# Patient Record
Sex: Female | Born: 1990 | Race: White | Hispanic: No | Marital: Single | Smoking: Never smoker
Health system: Southern US, Community
[De-identification: ages and names within clinical notes are randomized; demographics above are authoritative.]

## PROBLEM LIST (undated history)

## (undated) DIAGNOSIS — E785 Hyperlipidemia, unspecified: Secondary | ICD-10-CM

## (undated) DIAGNOSIS — Z0000 Encounter for general adult medical examination without abnormal findings: Secondary | ICD-10-CM

## (undated) DIAGNOSIS — H6120 Impacted cerumen, unspecified ear: Secondary | ICD-10-CM

## (undated) DIAGNOSIS — Z124 Encounter for screening for malignant neoplasm of cervix: Secondary | ICD-10-CM

## (undated) DIAGNOSIS — B019 Varicella without complication: Secondary | ICD-10-CM

## (undated) DIAGNOSIS — L309 Dermatitis, unspecified: Secondary | ICD-10-CM

## (undated) DIAGNOSIS — F988 Other specified behavioral and emotional disorders with onset usually occurring in childhood and adolescence: Secondary | ICD-10-CM

## (undated) HISTORY — DX: Varicella without complication: B01.9

## (undated) HISTORY — DX: Encounter for general adult medical examination without abnormal findings: Z00.00

## (undated) HISTORY — DX: Hyperlipidemia, unspecified: E78.5

## (undated) HISTORY — PX: TYMPANOSTOMY TUBE PLACEMENT: SHX32

## (undated) HISTORY — DX: Impacted cerumen, unspecified ear: H61.20

## (undated) HISTORY — DX: Encounter for screening for malignant neoplasm of cervix: Z12.4

## (undated) HISTORY — DX: Dermatitis, unspecified: L30.9

## (undated) HISTORY — DX: Other specified behavioral and emotional disorders with onset usually occurring in childhood and adolescence: F98.8

---
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
## 2012-01-15 ENCOUNTER — Ambulatory Visit (INDEPENDENT_AMBULATORY_CARE_PROVIDER_SITE_OTHER): Payer: BC Managed Care – PPO | Admitting: Family Medicine

## 2012-01-15 ENCOUNTER — Encounter: Payer: Self-pay | Admitting: Family Medicine

## 2012-01-15 VITALS — BP 110/74 | HR 76 | Temp 98.0°F | Ht 63.0 in | Wt 148.1 lb

## 2012-01-15 DIAGNOSIS — F988 Other specified behavioral and emotional disorders with onset usually occurring in childhood and adolescence: Secondary | ICD-10-CM

## 2012-01-15 MED ORDER — METHYLPHENIDATE HCL 10 MG PO TABS: ORAL_TABLET | ORAL | Status: DC

## 2012-01-15 MED ORDER — METHYLPHENIDATE HCL ER (LA) 40 MG PO CP24: 40.0000 mg | ORAL_CAPSULE | ORAL | Status: DC

## 2012-01-15 NOTE — Assessment & Plan Note (Signed)
Has titrated up the ritalin short acting to 300 mg in am and is finding it helpful without any side effects. Is now in college and finds it wears off after a couple of hours. Will switch to long acting Ritalin LA 40 mg in am and may use Ritalin short acting 10 mg tabs 1-2 tabs po qpm as needed for long study days, recheck vitals and assess dosing in 1 month or as needed.

## 2012-01-15 NOTE — Progress Notes (Signed)
[name], female   DOB: [birth year], 21 y.o.   MRN: [id number] Katie Tucker [id number] Jan 01, 1991 01/15/2012      Progress Note-Follow Up  Subjective  Chief Complaint  Chief Complaint  Patient presents with  . Follow-up    3 week on ADD meds    HPI  Patient is an 21 year old Caucasian female who is in today for reevaluation of her ADD meds. She is accompanied by her father. She's just started college and they do agree to the Ritalin is helping some. She has titrated her Ritalin short acting tablets to 30 mg in the a.m. and is not taking anything in the p.m. She notes if she takes it around 6 AM he tends to wear off by noon. She feels she needs a little more coverage during the day. She denies any side effects or concerns. She denies headaches, insomnia, chest pain complications, GI or GU concerns at this time  Past Medical History  Diagnosis Date  . Chicken pox as a child  . ADD (attention deficit disorder) 4 th grade  . Preventative health care 12/25/2011    Past Surgical History  Procedure Date  . Tympanostomy tube placement     Family History  Problem Relation Age of Onset  . Hyperlipidemia Mother   . Hypertension Father   . Cancer Father 20    testicular  . Cancer Maternal Grandmother     breast- remission  . Hypertension Paternal Grandfather     History   Social History  . Marital Status: Single    Spouse Name: N/A    Number of Children: N/A  . Years of Education: N/A   Occupational History  . Not on file.   Social History Main Topics  . Smoking status: Never Smoker   . Smokeless tobacco: Never Used  . Alcohol Use: No  . Drug Use: No  . Sexually Active: No   Other Topics Concern  . Not on file   Social History Narrative  . No narrative on file    Current Outpatient Prescriptions on File Prior to Visit  Medication Sig Dispense Refill  . DISCONTD: methylphenidate (RITALIN) 10 MG tablet Take 1 tablet (10 mg total) by mouth 2 (two)  times daily.  60 tablet  0    No Known Allergies  Review of Systems  Review of Systems  Constitutional: Negative for fever and malaise/fatigue.  HENT: Negative for congestion.   Eyes: Negative for discharge.  Respiratory: Negative for shortness of breath.   Cardiovascular: Negative for chest pain, palpitations and leg swelling.  Gastrointestinal: Negative for nausea, abdominal pain and diarrhea.  Genitourinary: Negative for dysuria.  Musculoskeletal: Negative for falls.  Skin: Negative for rash.  Neurological: Negative for loss of consciousness and headaches.  Endo/Heme/Allergies: Negative for polydipsia.  Psychiatric/Behavioral: Negative for depression and suicidal ideas. The patient is not nervous/anxious and does not have insomnia.     Objective  BP 110/74  Pulse 76  Temp 98 F (36.7 C) (Temporal)  Ht 5\' 3"  (1.6 m)  Wt 148 lb 1.9 oz (67.187 kg)  BMI 26.24 kg/m2  SpO2 100%  LMP 12/10/2011  Physical Exam  Physical Exam  Constitutional: She is oriented to person, place, and time and well-developed, well-nourished, and in no distress. No distress.  HENT:  Head: Normocephalic and atraumatic.  Eyes: Conjunctivae normal are normal. Right eye exhibits no discharge. Left eye exhibits no discharge.  Neck: Neck supple. No thyromegaly present.  Cardiovascular: Normal rate, regular rhythm  and normal heart sounds.   No murmur heard. Pulmonary/Chest: Effort normal and breath sounds normal. She has no wheezes.  Abdominal: Soft. Bowel sounds are normal.  Musculoskeletal: She exhibits no edema.  Lymphadenopathy:    She has no cervical adenopathy.  Neurological: She is alert and oriented to person, place, and time.  Skin: Skin is warm and dry. No rash noted. She is not diaphoretic.  Psychiatric: Memory, affect and judgment normal.      Assessment & Plan  ADD (attention deficit disorder) Has titrated up the ritalin short acting to 300 mg in am and is finding it helpful  without any side effects. Is now in college and finds it wears off after a couple of hours. Will switch to long acting Ritalin LA 40 mg in am and may use Ritalin short acting 10 mg tabs 1-2 tabs po qpm as needed for long study days, recheck vitals and assess dosing in 1 month or as needed.

## 2012-01-15 NOTE — Patient Instructions (Addendum)
Attention Deficit Hyperactivity Disorder Attention deficit hyperactivity disorder (ADHD) is a problem with behavior issues based on the way the brain functions (neurobehavioral disorder). It is a common reason for behavior and academic problems in school. CAUSES  The cause of ADHD is unknown in most cases. It may run in families. It sometimes can be associated with learning disabilities and other behavioral problems. SYMPTOMS  There are 3 types of ADHD. The 3 types and some of the symptoms include:  Inattentive   Gets bored or distracted easily.   Loses or forgets things. Forgets to hand in homework.   Has trouble organizing or completing tasks.   Difficulty staying on task.   An inability to organize daily tasks and school work.   Leaving projects, chores, or homework unfinished.   Trouble paying attention or responding to details. Careless mistakes.   Difficulty following directions. Often seems like is not listening.   Dislikes activities that require sustained attention (like chores or homework).   Hyperactive-impulsive   Feels like it is impossible to sit still or stay in a seat. Fidgeting with hands and feet.   Trouble waiting turn.   Talking too much or out of turn. Interruptive.   Speaks or acts impulsively.   Aggressive, disruptive behavior.   Constantly busy or on the go, noisy.   Combined   Has symptoms of both of the above.  Often children with ADHD feel discouraged about themselves and with school. They often perform well below their abilities in school. These symptoms can cause problems in home, school, and in relationships with peers. As children get older, the excess motor activities can calm down, but the problems with paying attention and staying organized persist. Most children do not outgrow ADHD but with good treatment can learn to cope with the symptoms. DIAGNOSIS  When ADHD is suspected, the diagnosis should be made by professionals trained in  ADHD.  Diagnosis will include:  Ruling out other reasons for the child's behavior.   The caregivers will check with the child's school and check their medical records.   They will talk to teachers and parents.   Behavior rating scales for the child will be filled out by those dealing with the child on a daily basis.  A diagnosis is made only after all information has been considered. TREATMENT  Treatment usually includes behavioral treatment often along with medicines. It may include stimulant medicines. The stimulant medicines decrease impulsivity and hyperactivity and increase attention. Other medicines used include antidepressants and certain blood pressure medicines. Most experts agree that treatment for ADHD should address all aspects of the child's functioning. Treatment should not be limited to the use of medicines alone. Treatment should include structured classroom management. The parents must receive education to address rewarding good behavior, discipline, and limit-setting. Tutoring or behavioral therapy or both should be available for the child. If untreated, the disorder can have long-term serious effects into adolescence and adulthood. HOME CARE INSTRUCTIONS   Often with ADHD there is a lot of frustration among the family in dealing with the illness. There is often blame and anger that is not warranted. This is a life long illness. There is no way to prevent ADHD. In many cases, because the problem affects the family as a whole, the entire family may need help. A therapist can help the family find better ways to handle the disruptive behaviors and promote change. If the child is young, most of the therapist's work is with the parents. Parents will   learn techniques for coping with and improving their child's behavior. Sometimes only the child with the ADHD needs counseling. Your caregivers can help you make these decisions.   Children with ADHD may need help in organizing. Some  helpful tips include:   Keep routines the same every day from wake-up time to bedtime. Schedule everything. This includes homework and playtime. This should include outdoor and indoor recreation. Keep the schedule on the refrigerator or a bulletin board where it is frequently seen. Mark schedule changes as far in advance as possible.   Have a place for everything and keep everything in its place. This includes clothing, backpacks, and school supplies.   Encourage writing down assignments and bringing home needed books.   Offer your child a well-balanced diet. Breakfast is especially important for school performance. Children should avoid drinks with caffeine including:   Soft drinks.   Coffee.   Tea.   However, some older children (adolescents) may find these drinks helpful in improving their attention.   Children with ADHD need consistent rules that they can understand and follow. If rules are followed, give small rewards. Children with ADHD often receive, and expect, criticism. Look for good behavior and praise it. Set realistic goals. Give clear instructions. Look for activities that can foster success and self-esteem. Make time for pleasant activities with your child. Give lots of affection.   Parents are their children's greatest advocates. Learn as much as possible about ADHD. This helps you become a stronger and better advocate for your child. It also helps you educate your child's teachers and instructors if they feel inadequate in these areas. Parent support groups are often helpful. A national group with local chapters is called CHADD (Children and Adults with Attention Deficit Hyperactivity Disorder).  PROGNOSIS  There is no cure for ADHD. Children with the disorder seldom outgrow it. Many find adaptive ways to accommodate the ADHD as they mature. SEEK MEDICAL CARE IF:  Your child has repeated muscle twitches, cough or speech outbursts.   Your child has sleep problems.   Your  child has a marked loss of appetite.   Your child develops depression.   Your child has new or worsening behavioral problems.   Your child develops dizziness.   Your child has a racing heart.   Your child has stomach pains.   Your child develops headaches.  Document Released: 03/29/2002 Document Revised: 03/28/2011 Document Reviewed: 11/09/2007 [facility] Patient Information 2012 [place], [place].

## 2012-02-12 ENCOUNTER — Encounter: Payer: Self-pay | Admitting: Family Medicine

## 2012-02-12 ENCOUNTER — Ambulatory Visit (INDEPENDENT_AMBULATORY_CARE_PROVIDER_SITE_OTHER): Payer: BC Managed Care – PPO | Admitting: Family Medicine

## 2012-02-12 VITALS — BP 127/83 | HR 99 | Temp 98.8°F | Ht 63.0 in | Wt 149.1 lb

## 2012-02-12 DIAGNOSIS — F988 Other specified behavioral and emotional disorders with onset usually occurring in childhood and adolescence: Secondary | ICD-10-CM

## 2012-02-12 MED ORDER — METHYLPHENIDATE HCL ER (LA) 40 MG PO CP24: 40.0000 mg | ORAL_CAPSULE | ORAL | Status: DC

## 2012-02-12 NOTE — Patient Instructions (Addendum)
Attention Deficit Hyperactivity Disorder Attention deficit hyperactivity disorder (ADHD) is a problem with behavior issues based on the way the brain functions (neurobehavioral disorder). It is a common reason for behavior and academic problems in school. CAUSES  The cause of ADHD is unknown in most cases. It may run in families. It sometimes can be associated with learning disabilities and other behavioral problems. SYMPTOMS  There are 3 types of ADHD. The 3 types and some of the symptoms include:  Inattentive  Gets bored or distracted easily.  Loses or forgets things. Forgets to hand in homework.  Has trouble organizing or completing tasks.  Difficulty staying on task.  An inability to organize daily tasks and school work.  Leaving projects, chores, or homework unfinished.  Trouble paying attention or responding to details. Careless mistakes.  Difficulty following directions. Often seems like is not listening.  Dislikes activities that require sustained attention (like chores or homework).  Hyperactive-impulsive  Feels like it is impossible to sit still or stay in a seat. Fidgeting with hands and feet.  Trouble waiting turn.  Talking too much or out of turn. Interruptive.  Speaks or acts impulsively.  Aggressive, disruptive behavior.  Constantly busy or on the go, noisy.  Combined  Has symptoms of both of the above. Often children with ADHD feel discouraged about themselves and with school. They often perform well below their abilities in school. These symptoms can cause problems in home, school, and in relationships with peers. As children get older, the excess motor activities can calm down, but the problems with paying attention and staying organized persist. Most children do not outgrow ADHD but with good treatment can learn to cope with the symptoms. DIAGNOSIS  When ADHD is suspected, the diagnosis should be made by professionals trained in ADHD.  Diagnosis will  include:  Ruling out other reasons for the child's behavior.  The caregivers will check with the child's school and check their medical records.  They will talk to teachers and parents.  Behavior rating scales for the child will be filled out by those dealing with the child on a daily basis. A diagnosis is made only after all information has been considered. TREATMENT  Treatment usually includes behavioral treatment often along with medicines. It may include stimulant medicines. The stimulant medicines decrease impulsivity and hyperactivity and increase attention. Other medicines used include antidepressants and certain blood pressure medicines. Most experts agree that treatment for ADHD should address all aspects of the child's functioning. Treatment should not be limited to the use of medicines alone. Treatment should include structured classroom management. The parents must receive education to address rewarding good behavior, discipline, and limit-setting. Tutoring or behavioral therapy or both should be available for the child. If untreated, the disorder can have long-term serious effects into adolescence and adulthood. HOME CARE INSTRUCTIONS   Often with ADHD there is a lot of frustration among the family in dealing with the illness. There is often blame and anger that is not warranted. This is a life long illness. There is no way to prevent ADHD. In many cases, because the problem affects the family as a whole, the entire family may need help. A therapist can help the family find better ways to handle the disruptive behaviors and promote change. If the child is young, most of the therapist's work is with the parents. Parents will learn techniques for coping with and improving their child's behavior. Sometimes only the child with the ADHD needs counseling. Your caregivers can help   you make these decisions.  Children with ADHD may need help in organizing. Some helpful tips include:  Keep  routines the same every day from wake-up time to bedtime. Schedule everything. This includes homework and playtime. This should include outdoor and indoor recreation. Keep the schedule on the refrigerator or a bulletin board where it is frequently seen. Mark schedule changes as far in advance as possible.  Have a place for everything and keep everything in its place. This includes clothing, backpacks, and school supplies.  Encourage writing down assignments and bringing home needed books.  Offer your child a well-balanced diet. Breakfast is especially important for school performance. Children should avoid drinks with caffeine including:  Soft drinks.  Coffee.  Tea.  However, some older children (adolescents) may find these drinks helpful in improving their attention.  Children with ADHD need consistent rules that they can understand and follow. If rules are followed, give small rewards. Children with ADHD often receive, and expect, criticism. Look for good behavior and praise it. Set realistic goals. Give clear instructions. Look for activities that can foster success and self-esteem. Make time for pleasant activities with your child. Give lots of affection.  Parents are their children's greatest advocates. Learn as much as possible about ADHD. This helps you become a stronger and better advocate for your child. It also helps you educate your child's teachers and instructors if they feel inadequate in these areas. Parent support groups are often helpful. A national group with local chapters is called CHADD (Children and Adults with Attention Deficit Hyperactivity Disorder). PROGNOSIS  There is no cure for ADHD. Children with the disorder seldom outgrow it. Many find adaptive ways to accommodate the ADHD as they mature. SEEK MEDICAL CARE IF:  Your child has repeated muscle twitches, cough or speech outbursts.  Your child has sleep problems.  Your child has a marked loss of  appetite.  Your child develops depression.  Your child has new or worsening behavioral problems.  Your child develops dizziness.  Your child has a racing heart.  Your child has stomach pains.  Your child develops headaches. Document Released: 03/29/2002 Document Revised: 07/01/2011 Document Reviewed: 11/09/2007 [employer] Patient Information 2013 [employer].  

## 2012-02-16 NOTE — Progress Notes (Signed)
[name], female   DOB: 1990-08-29, 21 y.o.   MRN: [id number] Blayne Garlick [id number] 19-Mar-1991 02/16/2012      Progress Note-Follow Up  Subjective  Chief Complaint  Chief Complaint  Patient presents with  . Follow-up    4 week    HPI  Patient is a 21 year old Caucasian female who is in today for followup on her Ritalin. She is pleased with increased dose of extended release 40 mg. She is focusing better and doing better in school. She denies headaches, chest pain, palpitations, shortness of breath, insomnia, increased anxiety or other concerns with the medications.  Past Medical History  Diagnosis Date  . Chicken pox as a child  . ADD (attention deficit disorder) 4 th grade  . Preventative health care 12/25/2011    Past Surgical History  Procedure Date  . Tympanostomy tube placement     Family History  Problem Relation Age of Onset  . Hyperlipidemia Mother   . Hypertension Father   . Cancer Father 20    testicular  . Cancer Maternal Grandmother     breast- remission  . Hypertension Paternal Grandfather     History   Social History  . Marital Status: Single    Spouse Name: N/A    Number of Children: N/A  . Years of Education: N/A   Occupational History  . Not on file.   Social History Main Topics  . Smoking status: Never Smoker   . Smokeless tobacco: Never Used  . Alcohol Use: No  . Drug Use: No  . Sexually Active: No   Other Topics Concern  . Not on file   Social History Narrative  . No narrative on file    Current Outpatient Prescriptions on File Prior to Visit  Medication Sig Dispense Refill  . methylphenidate (RITALIN LA) 40 MG 24 hr capsule Take 1 capsule (40 mg total) by mouth every morning. January 2014 rx  30 capsule  0    No Known Allergies  Review of Systems  Review of Systems  Constitutional: Negative for fever and malaise/fatigue.  HENT: Negative for congestion.   Eyes: Negative for discharge.  Respiratory:  Negative for shortness of breath.   Cardiovascular: Negative for chest pain, palpitations and leg swelling.  Gastrointestinal: Negative for nausea, abdominal pain and diarrhea.  Genitourinary: Negative for dysuria.  Musculoskeletal: Negative for falls.  Skin: Negative for rash.  Neurological: Negative for loss of consciousness and headaches.  Endo/Heme/Allergies: Negative for polydipsia.  Psychiatric/Behavioral: Negative for depression and suicidal ideas. The patient is not nervous/anxious and does not have insomnia.     Objective  BP 127/83  Pulse 99  Temp 98.8 F (37.1 C) (Temporal)  Ht 5\' 3"  (1.6 m)  Wt 149 lb 1.9 oz (67.64 kg)  BMI 26.42 kg/m2  SpO2 99%  LMP 02/03/2012  Physical Exam  Physical Exam  Constitutional: She is oriented to person, place, and time and well-developed, well-nourished, and in no distress. No distress.  HENT:  Head: Normocephalic and atraumatic.  Eyes: Conjunctivae normal are normal.  Neck: Neck supple. No thyromegaly present.  Cardiovascular: Normal rate, regular rhythm and normal heart sounds.   No murmur heard. Pulmonary/Chest: Effort normal and breath sounds normal. She has no wheezes.  Abdominal: She exhibits no distension and no mass.  Musculoskeletal: She exhibits no edema.  Lymphadenopathy:    She has no cervical adenopathy.  Neurological: She is alert and oriented to person, place, and time.  Skin: Skin is warm and  dry. No rash noted. She is not diaphoretic.  Psychiatric: Memory, affect and judgment normal.      Assessment & Plan  ADD (attention deficit disorder) Good response to Ritalin LA 40 mg daily, only using the 10 mg in afternoon infrequently. Given refills today

## 2012-02-16 NOTE — Assessment & Plan Note (Addendum)
Good response to Ritalin LA 40 mg daily, only using the 10 mg in afternoon infrequently. Given refills today

## 2012-05-13 ENCOUNTER — Ambulatory Visit: Payer: BC Managed Care – PPO | Admitting: Family Medicine

## 2012-06-26 ENCOUNTER — Ambulatory Visit: Payer: BC Managed Care – PPO | Admitting: Family Medicine

## 2012-09-16 ENCOUNTER — Ambulatory Visit: Payer: BC Managed Care – PPO | Admitting: Family Medicine

## 2012-11-04 ENCOUNTER — Encounter: Payer: Self-pay | Admitting: Nurse Practitioner

## 2012-11-04 ENCOUNTER — Ambulatory Visit (INDEPENDENT_AMBULATORY_CARE_PROVIDER_SITE_OTHER): Payer: BC Managed Care – PPO | Admitting: Nurse Practitioner

## 2012-11-04 VITALS — BP 90/54 | HR 75 | Temp 98.3°F | Ht 63.0 in | Wt 149.0 lb

## 2012-11-04 DIAGNOSIS — H6091 Unspecified otitis externa, right ear: Secondary | ICD-10-CM

## 2012-11-04 DIAGNOSIS — H60399 Other infective otitis externa, unspecified ear: Secondary | ICD-10-CM

## 2012-11-04 MED ORDER — OFLOXACIN 0.3 % OT SOLN: 10.0000 [drp] | Freq: Every day | OTIC | Status: DC

## 2012-11-04 NOTE — Progress Notes (Signed)
  Subjective:    [name], female    DOB: July 19, 1990, 22 y.o.   MRN: [id number]  Otalgia  There is pain in the right ear. This is a new problem. The current episode started 1 to 4 weeks ago (12 days). The problem occurs constantly. The problem has been gradually worsening. There has been no fever. The pain is mild. Pertinent negatives include no abdominal pain, coughing, diarrhea, drainage, ear discharge, headaches, hearing loss, neck pain, rash or sore throat. Associated symptoms comments: [place] She has tried NSAIDs for the symptoms. The treatment provided significant relief.      Review of Systems  Constitutional: Negative for fever, chills, activity change, appetite change and fatigue.  HENT: Positive for ear pain. Negative for hearing loss, congestion, sore throat, neck pain, postnasal drip, sinus pressure, tinnitus and ear discharge.   Eyes: Negative for redness.  Respiratory: Negative for cough.   Gastrointestinal: Negative for abdominal pain and diarrhea.  Skin: Negative for rash.  Neurological: Negative for headaches.  Hematological: Negative for adenopathy.       Objective:   Physical Exam  Vitals reviewed. Constitutional: She is oriented to person, place, and time. She appears well-developed and well-nourished. No distress.  HENT:  Head: Normocephalic and atraumatic.  Right Ear: There is drainage, swelling and tenderness. No mastoid tenderness. Tympanic membrane is not injected.  Left Ear: External ear normal.  Ears:  Mouth/Throat: Oropharynx is clear and moist. No oropharyngeal exudate.  Eyes: Conjunctivae are normal. Right eye exhibits no discharge. Left eye exhibits no discharge.  Neck: No thyromegaly present.  Cardiovascular: Normal rate, regular rhythm and normal heart sounds.   No murmur heard. Pulmonary/Chest: Effort normal and breath sounds normal. She has no wheezes.  Musculoskeletal:  Normal gait  Lymphadenopathy:   She has no cervical adenopathy.  Neurological: She is oriented to person, place, and time.  Skin: Skin is warm and dry. No rash noted.  Psychiatric: She has a normal mood and affect. Her behavior is normal. Thought content normal.          Assessment & Plan:  1. Otitis externa of right ear See intrucstions. - ofloxacin (FLOXIN) 0.3 % otic solution; Place 10 drops into the right ear daily.  Dispense: 5 mL; Refill: 0

## 2012-11-04 NOTE — Patient Instructions (Addendum)
Keep ear dry. No swimming for 7-10 days, or use ear plugs. Use cotton ball when getting in & out of shower.   Otitis Externa Otitis externa is a bacterial or fungal infection of the outer ear canal. This is the area from the eardrum to the outside of the ear. Otitis externa is sometimes called "swimmer's ear." CAUSES  Possible causes of infection include:  Swimming in dirty water.  Moisture remaining in the ear after swimming or bathing.  Mild injury (trauma) to the ear.  Objects stuck in the ear (foreign body).  Cuts or scrapes (abrasions) on the outside of the ear. SYMPTOMS  The first symptom of infection is often itching in the ear canal. Later signs and symptoms may include swelling and redness of the ear canal, ear pain, and yellowish-white fluid (pus) coming from the ear. The ear pain may be worse when pulling on the earlobe. DIAGNOSIS  Your caregiver will perform a physical exam. A sample of fluid may be taken from the ear and examined for bacteria or fungi. TREATMENT  Antibiotic ear drops are often given for 10 to 14 days. Treatment may also include pain medicine or corticosteroids to reduce itching and swelling. PREVENTION   Keep your ear dry. Use the corner of a towel to absorb water out of the ear canal after swimming or bathing.  Avoid scratching or putting objects inside your ear. This can damage the ear canal or remove the protective wax that lines the canal. This makes it easier for bacteria and fungi to grow.  Avoid swimming in lakes, polluted water, or poorly chlorinated pools.  You may use ear drops made of rubbing alcohol and vinegar after swimming. Combine equal parts of white vinegar and alcohol in a bottle. Put 3 or 4 drops into each ear after swimming. HOME CARE INSTRUCTIONS   Apply antibiotic ear drops to the ear canal as prescribed by your caregiver.  Only take over-the-counter or prescription medicines for pain, discomfort, or fever as directed by your  caregiver.  If you have diabetes, follow any additional treatment instructions from your caregiver.  Keep all follow-up appointments as directed by your caregiver. SEEK MEDICAL CARE IF:   You have a fever.  Your ear is still red, swollen, painful, or draining pus after 3 days.  Your redness, swelling, or pain gets worse.  You have a severe headache.  You have redness, swelling, pain, or tenderness in the area behind your ear. MAKE SURE YOU:   Understand these instructions.  Will watch your condition.  Will get help right away if you are not doing well or get worse. Document Released: 04/08/2005 Document Revised: 07/01/2011 Document Reviewed: 04/25/2011 [facility] Patient Information 2014 [place], [place].

## 2012-12-11 ENCOUNTER — Emergency Department (HOSPITAL_BASED_OUTPATIENT_CLINIC_OR_DEPARTMENT_OTHER): Payer: BC Managed Care – PPO

## 2012-12-11 ENCOUNTER — Encounter (HOSPITAL_BASED_OUTPATIENT_CLINIC_OR_DEPARTMENT_OTHER): Payer: Self-pay | Admitting: *Deleted

## 2012-12-11 ENCOUNTER — Emergency Department (HOSPITAL_BASED_OUTPATIENT_CLINIC_OR_DEPARTMENT_OTHER)
Admission: EM | Admit: 2012-12-11 | Discharge: 2012-12-11 | Disposition: A | Discharge status: Home / Self Care | Payer: BC Managed Care – PPO | Attending: Emergency Medicine | Admitting: Emergency Medicine

## 2012-12-11 DIAGNOSIS — S46909A Unspecified injury of unspecified muscle, fascia and tendon at shoulder and upper arm level, unspecified arm, initial encounter: Secondary | ICD-10-CM | POA: Insufficient documentation

## 2012-12-11 DIAGNOSIS — F988 Other specified behavioral and emotional disorders with onset usually occurring in childhood and adolescence: Secondary | ICD-10-CM | POA: Insufficient documentation

## 2012-12-11 DIAGNOSIS — Y92410 Unspecified street and highway as the place of occurrence of the external cause: Secondary | ICD-10-CM | POA: Insufficient documentation

## 2012-12-11 DIAGNOSIS — Y9389 Activity, other specified: Secondary | ICD-10-CM | POA: Insufficient documentation

## 2012-12-11 DIAGNOSIS — Z8619 Personal history of other infectious and parasitic diseases: Secondary | ICD-10-CM | POA: Insufficient documentation

## 2012-12-11 DIAGNOSIS — S6990XA Unspecified injury of unspecified wrist, hand and finger(s), initial encounter: Secondary | ICD-10-CM | POA: Insufficient documentation

## 2012-12-11 DIAGNOSIS — S4992XA Unspecified injury of left shoulder and upper arm, initial encounter: Secondary | ICD-10-CM

## 2012-12-11 DIAGNOSIS — S59909A Unspecified injury of unspecified elbow, initial encounter: Secondary | ICD-10-CM | POA: Insufficient documentation

## 2012-12-11 DIAGNOSIS — S4980XA Other specified injuries of shoulder and upper arm, unspecified arm, initial encounter: Secondary | ICD-10-CM | POA: Insufficient documentation

## 2012-12-11 MED ORDER — ACETAMINOPHEN 325 MG PO TABS
650.0000 mg | ORAL_TABLET | Freq: Once | ORAL | Status: AC
  Administered 2012-12-11: 650 mg via ORAL
  Filled 2012-12-11: qty 2

## 2012-12-11 MED ORDER — CYCLOBENZAPRINE HCL 10 MG PO TABS: 10.0000 mg | ORAL_TABLET | Freq: Two times a day (BID) | ORAL | Status: DC | PRN
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
## 2012-12-11 NOTE — ED Provider Notes (Signed)
Medical screening examination/treatment/procedure(s) were performed by non-physician practitioner and as supervising physician I was immediately available for consultation/collaboration.  Doug Sou, MD 12/11/12 [phone]
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
## 2013-01-05 ENCOUNTER — Telehealth: Payer: Self-pay | Admitting: Family Medicine

## 2013-01-05 NOTE — Telephone Encounter (Signed)
Patients mother called in stating that patient needs a new prescription of ritalin

## 2013-01-05 NOTE — Telephone Encounter (Signed)
Please advise? Last OV 02-12-12 w/Blyth 05-13-12- pt cancelled 06-26-12- pt cancelled 09-16-12- pt cancelled 11-04-12- pt saw Layne  No record to speak to mom

## 2013-01-05 NOTE — Telephone Encounter (Signed)
Patient is coming in tomorrow for this

## 2013-01-05 NOTE — Telephone Encounter (Signed)
Will see patient tomorrow.

## 2013-01-06 ENCOUNTER — Ambulatory Visit (INDEPENDENT_AMBULATORY_CARE_PROVIDER_SITE_OTHER): Payer: BC Managed Care – PPO | Admitting: Family Medicine

## 2013-01-06 ENCOUNTER — Ambulatory Visit: Payer: BC Managed Care – PPO | Admitting: Physician Assistant

## 2013-01-06 ENCOUNTER — Encounter: Payer: Self-pay | Admitting: Family Medicine

## 2013-01-06 VITALS — BP 110/80 | HR 76 | Temp 98.9°F | Ht 63.0 in | Wt 143.1 lb

## 2013-01-06 DIAGNOSIS — F988 Other specified behavioral and emotional disorders with onset usually occurring in childhood and adolescence: Secondary | ICD-10-CM

## 2013-01-06 DIAGNOSIS — Z23 Encounter for immunization: Secondary | ICD-10-CM

## 2013-01-06 MED ORDER — AMPHETAMINE-DEXTROAMPHETAMINE 10 MG PO TABS: 10.0000 mg | ORAL_TABLET | Freq: Two times a day (BID) | ORAL | Status: DC

## 2013-01-06 NOTE — Patient Instructions (Addendum)
Attention Deficit Hyperactivity Disorder Attention deficit hyperactivity disorder (ADHD) is a problem with behavior issues based on the way the brain functions (neurobehavioral disorder). It is a common reason for behavior and academic problems in school. CAUSES  The cause of ADHD is unknown in most cases. It may run in families. It sometimes can be associated with learning disabilities and other behavioral problems. SYMPTOMS  There are 3 types of ADHD. The 3 types and some of the symptoms include:  Inattentive  Gets bored or distracted easily.  Loses or forgets things. Forgets to hand in homework.  Has trouble organizing or completing tasks.  Difficulty staying on task.  An inability to organize daily tasks and school work.  Leaving projects, chores, or homework unfinished.  Trouble paying attention or responding to details. Careless mistakes.  Difficulty following directions. Often seems like is not listening.  Dislikes activities that require sustained attention (like chores or homework).  Hyperactive-impulsive  Feels like it is impossible to sit still or stay in a seat. Fidgeting with hands and feet.  Trouble waiting turn.  Talking too much or out of turn. Interruptive.  Speaks or acts impulsively.  Aggressive, disruptive behavior.  Constantly busy or on the go, noisy.  Combined  Has symptoms of both of the above. Often children with ADHD feel discouraged about themselves and with school. They often perform well below their abilities in school. These symptoms can cause problems in home, school, and in relationships with peers. As children get older, the excess motor activities can calm down, but the problems with paying attention and staying organized persist. Most children do not outgrow ADHD but with good treatment can learn to cope with the symptoms. DIAGNOSIS  When ADHD is suspected, the diagnosis should be made by professionals trained in ADHD.  Diagnosis will  include:  Ruling out other reasons for the child's behavior.  The caregivers will check with the child's school and check their medical records.  They will talk to teachers and parents.  Behavior rating scales for the child will be filled out by those dealing with the child on a daily basis. A diagnosis is made only after all information has been considered. TREATMENT  Treatment usually includes behavioral treatment often along with medicines. It may include stimulant medicines. The stimulant medicines decrease impulsivity and hyperactivity and increase attention. Other medicines used include antidepressants and certain blood pressure medicines. Most experts agree that treatment for ADHD should address all aspects of the child's functioning. Treatment should not be limited to the use of medicines alone. Treatment should include structured classroom management. The parents must receive education to address rewarding good behavior, discipline, and limit-setting. Tutoring or behavioral therapy or both should be available for the child. If untreated, the disorder can have long-term serious effects into adolescence and adulthood. HOME CARE INSTRUCTIONS   Often with ADHD there is a lot of frustration among the family in dealing with the illness. There is often blame and anger that is not warranted. This is a life long illness. There is no way to prevent ADHD. In many cases, because the problem affects the family as a whole, the entire family may need help. A therapist can help the family find better ways to handle the disruptive behaviors and promote change. If the child is young, most of the therapist's work is with the parents. Parents will learn techniques for coping with and improving their child's behavior. Sometimes only the child with the ADHD needs counseling. Your caregivers can help   you make these decisions.  Children with ADHD may need help in organizing. Some helpful tips include:  Keep  routines the same every day from wake-up time to bedtime. Schedule everything. This includes homework and playtime. This should include outdoor and indoor recreation. Keep the schedule on the refrigerator or a bulletin board where it is frequently seen. Mark schedule changes as far in advance as possible.  Have a place for everything and keep everything in its place. This includes clothing, backpacks, and school supplies.  Encourage writing down assignments and bringing home needed books.  Offer your child a well-balanced diet. Breakfast is especially important for school performance. Children should avoid drinks with caffeine including:  Soft drinks.  Coffee.  Tea.  However, some older children (adolescents) may find these drinks helpful in improving their attention.  Children with ADHD need consistent rules that they can understand and follow. If rules are followed, give small rewards. Children with ADHD often receive, and expect, criticism. Look for good behavior and praise it. Set realistic goals. Give clear instructions. Look for activities that can foster success and self-esteem. Make time for pleasant activities with your child. Give lots of affection.  Parents are their children's greatest advocates. Learn as much as possible about ADHD. This helps you become a stronger and better advocate for your child. It also helps you educate your child's teachers and instructors if they feel inadequate in these areas. Parent support groups are often helpful. A national group with local chapters is called CHADD (Children and Adults with Attention Deficit Hyperactivity Disorder). PROGNOSIS  There is no cure for ADHD. Children with the disorder seldom outgrow it. Many find adaptive ways to accommodate the ADHD as they mature. SEEK MEDICAL CARE IF:  Your child has repeated muscle twitches, cough or speech outbursts.  Your child has sleep problems.  Your child has a marked loss of  appetite.  Your child develops depression.  Your child has new or worsening behavioral problems.  Your child develops dizziness.  Your child has a racing heart.  Your child has stomach pains.  Your child develops headaches. Document Released: 03/29/2002 Document Revised: 07/01/2011 Document Reviewed: 11/09/2007 [employer] Patient Information 2014 [employer].  

## 2013-01-06 NOTE — Progress Notes (Signed)
[name], female   DOB: 08-28-1990, 22 y.o.   MRN: [id number] Katie Tucker [id number] 1990-07-11 01/06/2013      Progress Note-Follow Up  Subjective  Chief Complaint  Chief Complaint  Patient presents with  . Follow-up    on ADD medicaton  . Injections    flu    HPI  Patient is a 22 year old Caucasian female who is in today for followup on her ADD. She has been using Ritalin with some improvement in concentration but unfortunately been having headaches as well. No chest pain or palpitations. No neurologic complaints otherwise. No insomnia shortness or breath, increased anxiety  Past Medical History  Diagnosis Date  . Chicken pox as a child  . ADD (attention deficit disorder) 4 th grade  . Preventative health care 12/25/2011    Past Surgical History  Procedure Laterality Date  . Tympanostomy tube placement      Family History  Problem Relation Age of Onset  . Hyperlipidemia Mother   . Hypertension Father   . Cancer Father 20    testicular  . Cancer Maternal Grandmother     breast- remission  . Hypertension Paternal Grandfather     History   Social History  . Marital Status: Single    Spouse Name: N/A    Number of Children: N/A  . Years of Education: N/A   Occupational History  . Not on file.   Social History Main Topics  . Smoking status: Never Smoker   . Smokeless tobacco: Never Used  . Alcohol Use: No  . Drug Use: No  . Sexual Activity: No   Other Topics Concern  . Not on file   Social History Narrative  . No narrative on file    No current outpatient prescriptions on file prior to visit.   No current facility-administered medications on file prior to visit.    No Known Allergies  Review of Systems  Review of Systems  Constitutional: Negative for fever and malaise/fatigue.  HENT: Negative for congestion.   Eyes: Negative for discharge.  Respiratory: Negative for shortness of breath.   Cardiovascular: Negative for chest  pain, palpitations and leg swelling.  Gastrointestinal: Negative for nausea, abdominal pain and diarrhea.  Genitourinary: Negative for dysuria.  Musculoskeletal: Negative for falls.  Skin: Negative for rash.  Neurological: Negative for loss of consciousness and headaches.  Endo/Heme/Allergies: Negative for polydipsia.  Psychiatric/Behavioral: Negative for depression and suicidal ideas. The patient is not nervous/anxious and does not have insomnia.     Objective  BP 110/80  Pulse 76  Temp(Src) 98.9 F (37.2 C) (Oral)  Ht 5\' 3"  (1.6 m)  Wt 143 lb 1.9 oz (64.919 kg)  BMI 25.36 kg/m2  SpO2 98%  LMP 12/11/2012  Physical Exam  Physical Exam  Constitutional: She is oriented to person, place, and time and well-developed, well-nourished, and in no distress. No distress.  HENT:  Head: Normocephalic and atraumatic.  Eyes: Conjunctivae are normal.  Neck: Neck supple. No thyromegaly present.  Cardiovascular: Normal rate, regular rhythm and normal heart sounds.   No murmur heard. Pulmonary/Chest: Effort normal and breath sounds normal. She has no wheezes.  Abdominal: She exhibits no distension and no mass.  Musculoskeletal: She exhibits no edema.  Lymphadenopathy:    She has no cervical adenopathy.  Neurological: She is alert and oriented to person, place, and time.  Skin: Skin is warm and dry. No rash noted. She is not diaphoretic.  Psychiatric: Memory, affect and judgment normal.  Assessment & Plan  ADD (attention deficit disorder) Will try to switch to Adderall patient will report any concerning symptoms

## 2013-01-08 ENCOUNTER — Telehealth: Payer: Self-pay

## 2013-01-08 NOTE — Telephone Encounter (Signed)
PA sent to [facility] for Amphetamine 10 mg

## 2013-01-09 NOTE — Assessment & Plan Note (Signed)
Will try to switch to Adderall patient will report any concerning symptoms

## 2013-01-22 NOTE — Telephone Encounter (Signed)
Patients PA has been approved 12-09-12 through 01-08-16

## 2013-01-29 ENCOUNTER — Ambulatory Visit (INDEPENDENT_AMBULATORY_CARE_PROVIDER_SITE_OTHER): Payer: BC Managed Care – PPO | Admitting: Family Medicine

## 2013-01-29 ENCOUNTER — Encounter: Payer: Self-pay | Admitting: Family Medicine

## 2013-01-29 VITALS — BP 100/78 | HR 80 | Temp 98.4°F | Ht 63.0 in | Wt 142.0 lb

## 2013-01-29 DIAGNOSIS — H6121 Impacted cerumen, right ear: Secondary | ICD-10-CM

## 2013-01-29 DIAGNOSIS — H60393 Other infective otitis externa, bilateral: Secondary | ICD-10-CM

## 2013-01-29 DIAGNOSIS — H60399 Other infective otitis externa, unspecified ear: Secondary | ICD-10-CM

## 2013-01-29 DIAGNOSIS — H6123 Impacted cerumen, bilateral: Secondary | ICD-10-CM

## 2013-01-29 DIAGNOSIS — F988 Other specified behavioral and emotional disorders with onset usually occurring in childhood and adolescence: Secondary | ICD-10-CM

## 2013-01-29 DIAGNOSIS — H6120 Impacted cerumen, unspecified ear: Secondary | ICD-10-CM

## 2013-01-29 HISTORY — DX: Impacted cerumen, unspecified ear: H61.20

## 2013-01-29 MED ORDER — AMPHETAMINE-DEXTROAMPHETAMINE 20 MG PO TABS: ORAL_TABLET | ORAL | Status: DC

## 2013-01-29 MED ORDER — NEOMYCIN-POLYMYXIN-HC 3.5-10000-1 OT SOLN: 3.0000 [drp] | Freq: Every day | OTIC | Status: DC

## 2013-01-29 NOTE — Patient Instructions (Signed)
  Try ginger caps or tea or candies   Nausea, Adult Nausea is the feeling that you have an upset stomach or have to vomit. Nausea by itself is not likely a serious concern, but it may be an early sign of more serious medical problems. As nausea gets worse, it can lead to vomiting. If vomiting develops, there is the risk of dehydration.  CAUSES   Viral infections.  Food poisoning.  Medicines.  Pregnancy.  Motion sickness.  Migraine headaches.  Emotional distress.  Severe pain from any source.  Alcohol intoxication. HOME CARE INSTRUCTIONS  Get plenty of rest.  Ask your caregiver about specific rehydration instructions.  Eat small amounts of food and sip liquids more often.  Take all medicines as told by your caregiver. SEEK MEDICAL CARE IF:  You have not improved after 2 days, or you get worse.  You have a headache. SEEK IMMEDIATE MEDICAL CARE IF:   You have a fever.  You faint.  You keep vomiting or have blood in your vomit.  You are extremely weak or dehydrated.  You have dark or bloody stools.  You have severe chest or abdominal pain. MAKE SURE YOU:  Understand these instructions.  Will watch your condition.  Will get help right away if you are not doing well or get worse. Document Released: 05/16/2004 Document Revised: 01/01/2012 Document Reviewed: 12/19/2010 [facility] Patient Information 2014 [place], [place].

## 2013-01-29 NOTE — Assessment & Plan Note (Signed)
Adderall seems to keep her calm and focused. The short acting lasts a good long time for her so will stay with this 20 mg works better than 10 given 20 mg tabs 1 in am and 1/2 in pm as needed. Does c/o mild nausea in am, encouraged to eat with dosing and try ginger, if worsens let us know. Will check weight in 2 months

## 2013-01-29 NOTE — Progress Notes (Signed)
Katie Tucker [id number] 12/07/1990 01/29/2013      Progress Note-Follow Up  Subjective  Chief Complaint  Chief Complaint  Patient presents with  . Follow-up    4 week follow up on Adderall    HPI  Patient is a 22 year old Caucasian female who is in today for followup on her ADD meds. She notes the Adderall works best at 20 mg in a.m. And seems to work well throughout the day. Her headaches have not worsened although she does struggle with daily headaches and acknowledges dehydration and poor sleep. This is ongoing. No worsening insomnia. She denies chest pain or palpitations or shortness of breath. She notes some mild nausea in the morning but not today. No diarrhea or vomiting. No other acute complaints except for some mild discomfort in her right ear in particular and an occluded popping sensation occasionally in her ears over the last couple of weeks Past Medical History  Diagnosis Date  . Chicken pox as a child  . ADD (attention deficit disorder) 4 th grade  . Preventative health care 12/25/2011  . Cerumen impaction 01/29/2013    Past Surgical History  Procedure Laterality Date  . Tympanostomy tube placement      Family History  Problem Relation Age of Onset  . Hyperlipidemia Mother   . Hypertension Father   . Cancer Father 20    testicular  . Cancer Maternal Grandmother     breast- remission  . Hypertension Paternal Grandfather     History   Social History  . Marital Status: Single    Spouse Name: N/A    Number of Children: N/A  . Years of Education: N/A   Occupational History  . Not on file.   Social History Main Topics  . Smoking status: Never Smoker   . Smokeless tobacco: Never Used  . Alcohol Use: No  . Drug Use: No  . Sexual Activity: No   Other Topics Concern  . Not on file   Social History Narrative  . No narrative on file    No current outpatient prescriptions on file prior to visit.   No current facility-administered medications on  file prior to visit.    No Known Allergies  Review of Systems  Review of Systems  Constitutional: Negative for fever and malaise/fatigue.  HENT: Positive for ear pain and hearing loss. Negative for congestion, ear discharge and tinnitus.   Eyes: Negative for discharge.  Respiratory: Negative for shortness of breath.   Cardiovascular: Negative for chest pain, palpitations and leg swelling.  Gastrointestinal: Positive for nausea. Negative for abdominal pain and diarrhea.  Genitourinary: Negative for dysuria.  Musculoskeletal: Negative for falls.  Skin: Negative for rash.  Neurological: Positive for headaches. Negative for loss of consciousness.  Endo/Heme/Allergies: Negative for polydipsia.  Psychiatric/Behavioral: Negative for depression and suicidal ideas. The patient is not nervous/anxious and does not have insomnia.     Objective  BP 100/78  Pulse 80  Temp(Src) 98.4 F (36.9 C) (Oral)  Ht 5\' 3"  (1.6 m)  Wt 142 lb 0.6 oz (64.429 kg)  BMI 25.17 kg/m2  SpO2 97%  LMP 01/11/2013  Physical Exam  Physical Exam  Constitutional: She is oriented to person, place, and time and well-developed, well-nourished, and in no distress. No distress.  HENT:  Head: Normocephalic and atraumatic.  Slight wax left canal, right canal occluded, after flushed, b/l canls mildly macerated and erythematous  Eyes: Conjunctivae are normal.  Neck: Neck supple. No thyromegaly present.  Cardiovascular: Normal rate, regular  rhythm and normal heart sounds.   No murmur heard. Pulmonary/Chest: Effort normal and breath sounds normal. She has no wheezes.  Abdominal: She exhibits no distension and no mass.  Musculoskeletal: She exhibits no edema.  Lymphadenopathy:    She has no cervical adenopathy.  Neurological: She is alert and oriented to person, place, and time.  Skin: Skin is warm and dry. No rash noted. She is not diaphoretic.  Psychiatric: Memory, affect and judgment normal.     Assessment &  Plan  Cerumen impaction Flushed patient tolerated well but canals slightly irritated, given Cortisporin otic drops to use qhs  ADD (attention deficit disorder) Adderall seems to keep her calm and focused. The short acting lasts a good long time for her so will stay with this 20 mg works better than 10 given 20 mg tabs 1 in am and 1/2 in pm as needed. Does c/o mild nausea in am, encouraged to eat with dosing and try ginger, if worsens let us know. Will check weight in 2 months

## 2013-01-29 NOTE — Assessment & Plan Note (Signed)
Flushed patient tolerated well but canals slightly irritated, given Cortisporin otic drops to use qhs

## 2013-04-13 ENCOUNTER — Ambulatory Visit: Payer: BC Managed Care – PPO | Admitting: Family Medicine

## 2013-05-03 ENCOUNTER — Ambulatory Visit: Payer: BC Managed Care – PPO | Admitting: Family Medicine

## 2013-05-10 ENCOUNTER — Ambulatory Visit: Payer: BC Managed Care – PPO | Admitting: Family Medicine

## 2013-06-04 ENCOUNTER — Encounter: Payer: Self-pay | Admitting: Family Medicine

## 2013-06-04 ENCOUNTER — Ambulatory Visit (INDEPENDENT_AMBULATORY_CARE_PROVIDER_SITE_OTHER): Payer: BC Managed Care – PPO | Admitting: Family Medicine

## 2013-06-04 ENCOUNTER — Telehealth: Payer: Self-pay | Admitting: *Deleted

## 2013-06-04 ENCOUNTER — Ambulatory Visit (HOSPITAL_BASED_OUTPATIENT_CLINIC_OR_DEPARTMENT_OTHER)
Admission: RE | Admit: 2013-06-04 | Discharge: 2013-06-04 | Disposition: A | Discharge status: Home / Self Care | Payer: BC Managed Care – PPO | Source: Ambulatory Visit | Attending: Family Medicine | Admitting: Family Medicine

## 2013-06-04 VITALS — BP 104/68 | HR 67 | Temp 98.4°F | Resp 14 | Ht 63.0 in | Wt 139.2 lb

## 2013-06-04 DIAGNOSIS — F988 Other specified behavioral and emotional disorders with onset usually occurring in childhood and adolescence: Secondary | ICD-10-CM

## 2013-06-04 DIAGNOSIS — M545 Low back pain, unspecified: Secondary | ICD-10-CM | POA: Insufficient documentation

## 2013-06-04 DIAGNOSIS — M549 Dorsalgia, unspecified: Secondary | ICD-10-CM | POA: Insufficient documentation

## 2013-06-04 DIAGNOSIS — R110 Nausea: Secondary | ICD-10-CM | POA: Insufficient documentation

## 2013-06-04 MED ORDER — AMPHETAMINE-DEXTROAMPHETAMINE 20 MG PO TABS: ORAL_TABLET | ORAL | Status: DC

## 2013-06-04 NOTE — Patient Instructions (Signed)
Try Advil/Motrin/Ibuprofen 200 mg tabs 2 twice a day for pain with food (Naproxen/Aleve is another option 220 mg tabs one tab twice a day) Try moist heat/gentle stretching and consider patches such as Icy hot or salon pas   Dysmenorrhea Menstrual cramps (dysmenorrhea) are caused by the muscles of the uterus tightening (contracting) during a menstrual period. For some women, this discomfort is merely bothersome. For others, dysmenorrhea can be severe enough to interfere with everyday activities for a few days each month. Primary dysmenorrhea is menstrual cramps that last a couple of days when you start having menstrual periods or soon after. This often begins after a teenager starts having her period. As a woman gets older or has a baby, the cramps will usually lessen or disappear. Secondary dysmenorrhea begins later in life, lasts longer, and the pain may be stronger than primary dysmenorrhea. The pain may start before the period and last a few days after the period.  CAUSES  Dysmenorrhea is usually caused by an underlying problem, such as:  The tissue lining the uterus grows outside of the uterus in other areas of the body (endometriosis).  The endometrial tissue, which normally lines the uterus, is found in or grows into the muscular walls of the uterus (adenomyosis).  The pelvic blood vessels are engorged with blood just before the menstrual period (pelvic congestive syndrome).  Overgrowth of cells (polyps) in the lining of the uterus or cervix.  Falling down of the uterus (prolapse) because of loose or stretched ligaments.  Depression.  Bladder problems, infection, or inflammation.  Problems with the intestine, a tumor, or irritable bowel syndrome.  Cancer of the female organs or bladder.  A severely tipped uterus.  A very tight opening or closed cervix.  Noncancerous tumors of the uterus (fibroids).  Pelvic inflammatory disease (PID).  Pelvic scarring (adhesions) from a  previous surgery.  Ovarian cyst.  An intrauterine device (IUD) used for birth control. RISK FACTORS You may be at greater risk of dysmenorrhea if:  You are younger than age 28.  You started puberty early.  You have irregular or heavy bleeding.  You have never given birth.  You have a family history of this problem.  You are a smoker. SIGNS AND SYMPTOMS   Cramping or throbbing pain in your lower abdomen.  Headaches.  Lower back pain.  Nausea or vomiting.  Diarrhea.  Sweating or dizziness.  Loose stools. DIAGNOSIS  A diagnosis is based on your history, symptoms, physical exam, diagnostic tests, or procedures. Diagnostic tests or procedures may include:  Blood tests.  Ultrasonography.  An examination of the lining of the uterus (dilation and curettage, D&C).  An examination inside your abdomen or pelvis with a scope (laparoscopy).  X-rays.  CT scan.  MRI.  An examination inside the bladder with a scope (cystoscopy).  An examination inside the intestine or stomach with a scope (colonoscopy, gastroscopy). TREATMENT  Treatment depends on the cause of the dysmenorrhea. Treatment may include:  Pain medicine prescribed by your health care provider.  Birth control pills or an IUD with progesterone hormone in it.  Hormone replacement therapy.  Nonsteroidal anti-inflammatory drugs (NSAIDs). These may help stop the production of prostaglandins.  Surgery to remove adhesions, endometriosis, ovarian cyst, or fibroids.  Removal of the uterus (hysterectomy).  Progesterone shots to stop the menstrual period.  Cutting the nerves on the sacrum that go to the female organs (presacral neurectomy).  Electric current to the sacral nerves (sacral nerve stimulation).  Antidepressant medicine.  Psychiatric therapy, counseling, or group therapy.  Exercise and physical therapy.  Meditation and yoga therapy.  Acupuncture. HOME CARE INSTRUCTIONS   Only take  over-the-counter or prescription medicines as directed by your health care provider.  Place a heating pad or hot water bottle on your lower back or abdomen. Do not sleep with the heating pad.  Use aerobic exercises, walking, swimming, biking, and other exercises to help lessen the cramping.  Massage to the lower back or abdomen may help.  Stop smoking.  Avoid alcohol and caffeine. SEEK MEDICAL CARE IF:   Your pain does not get better with medicine.  You have pain with sexual intercourse.  Your pain increases and is not controlled with medicines.  You have abnormal vaginal bleeding with your period.  You develop nausea or vomiting with your period that is not controlled with medicine. SEEK IMMEDIATE MEDICAL CARE IF:  You pass out.  Document Released: 04/08/2005 Document Revised: 12/09/2012 Document Reviewed: 09/24/2012 [facility] Patient Information 2014 [place], [place].

## 2013-06-04 NOTE — Progress Notes (Signed)
Pre visit review using our clinic review tool, if applicable. No additional management support is needed unless otherwise documented below in the visit note/SLS  

## 2013-06-04 NOTE — Assessment & Plan Note (Signed)
No radiculopathy or incontinence and mild most days. Encouraged moist heat and gentle stretching, topical patches such as salon pas and will proceed with xrays due to longevity of symptoms and referred to PT for short course of treatment. May use NSAIDs with food prn

## 2013-06-04 NOTE — Assessment & Plan Note (Signed)
Doing well on Adderall but does not need it everyday. Is allowed refills today and return in 6 months for reevaluation or sooner as needed
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
## 2013-06-04 NOTE — Assessment & Plan Note (Signed)
Had some trouble with this a couple of weeks ago but it has resolved now. Encouraged daily probiotic and ginger prn if returns. Report persistent symptoms

## 2013-06-04 NOTE — Telephone Encounter (Signed)
Received message from pt requesting xray results.  Notified pt per results note below and she voices understanding.  Entered by Bradd CanaryStacey A Blyth, MD at 06/04/2013 4:46 PM Looks good no acute abnormality, does show basically mild scoliosis which has probably contributed to the persistent pain, physical therapy should help this.

## 2013-09-30 ENCOUNTER — Ambulatory Visit (INDEPENDENT_AMBULATORY_CARE_PROVIDER_SITE_OTHER): Payer: BC Managed Care – PPO | Admitting: Family Medicine

## 2013-09-30 ENCOUNTER — Encounter: Payer: Self-pay | Admitting: Family Medicine

## 2013-09-30 VITALS — BP 120/72 | HR 89 | Temp 98.1°F | Ht 63.0 in | Wt 140.0 lb

## 2013-09-30 DIAGNOSIS — N926 Irregular menstruation, unspecified: Secondary | ICD-10-CM

## 2013-09-30 DIAGNOSIS — F988 Other specified behavioral and emotional disorders with onset usually occurring in childhood and adolescence: Secondary | ICD-10-CM

## 2013-09-30 DIAGNOSIS — H60399 Other infective otitis externa, unspecified ear: Secondary | ICD-10-CM

## 2013-09-30 DIAGNOSIS — H6120 Impacted cerumen, unspecified ear: Secondary | ICD-10-CM

## 2013-09-30 MED ORDER — METHYLPHENIDATE 15 MG/9HR TD PTCH: 15.0000 mg | MEDICATED_PATCH | Freq: Every day | TRANSDERMAL | Status: DC

## 2013-09-30 MED ORDER — NEOMYCIN-POLYMYXIN-HC 3.5-10000-1 OT SOLN: 3.0000 [drp] | Freq: Every day | OTIC | Status: DC

## 2013-09-30 MED ORDER — DESOGESTREL-ETHINYL ESTRADIOL 0.15-0.02/0.01 MG (21/5) PO TABS: 1.0000 | ORAL_TABLET | Freq: Every day | ORAL | Status: DC

## 2013-09-30 NOTE — Assessment & Plan Note (Addendum)
With dysmenorrhea, start Kariva next Sunday after cycle

## 2013-09-30 NOTE — Assessment & Plan Note (Addendum)
Switch to Daytrana at patient request. Return in one monthto reeval. May only use Adderall in pm and use Daytrana in am

## 2013-09-30 NOTE — Assessment & Plan Note (Signed)
Otitis externa

## 2013-09-30 NOTE — Patient Instructions (Signed)

## 2013-10-08 ENCOUNTER — Encounter: Payer: Self-pay | Admitting: Family Medicine

## 2013-10-08 NOTE — Progress Notes (Signed)
[name], female   DOB: [birth year], 23 y.o.   MRN: [id number] Katie Tucker [id number] [birth year] 10/08/2013      Progress Note-Follow Up  Subjective  Chief Complaint  Chief Complaint  Patient presents with  . abnormal periods    long period     HPI  Patient is a 23 year old female in today for routine medical care. C/o wax in ears. But major complaint is cerumen impaction b/l. Affects hearing slightly. No pain. Denies CP/palp/SOB/HA/congestion/fevers/GI or GU c/o. Taking meds as prescribed  Past Medical History  Diagnosis Date  . Chicken pox as a child  . ADD (attention deficit disorder) 4 th grade  . Preventative health care 12/25/2011  . Cerumen impaction 01/29/2013    Past Surgical History  Procedure Laterality Date  . Tympanostomy tube placement      Family History  Problem Relation Age of Onset  . Hyperlipidemia Mother   . Hypertension Father   . Cancer Father 20    testicular  . Cancer Maternal Grandmother     breast- remission  . Hypertension Paternal Grandfather     History   Social History  . Marital Status: Single    Spouse Name: N/A    Number of Children: N/A  . Years of Education: N/A   Occupational History  . Not on file.   Social History Main Topics  . Smoking status: Never Smoker   . Smokeless tobacco: Never Used  . Alcohol Use: No  . Drug Use: No  . Sexual Activity: No   Other Topics Concern  . Not on file   Social History Narrative  . No narrative on file    Current Outpatient Prescriptions on File Prior to Visit  Medication Sig Dispense Refill  . amphetamine-dextroamphetamine (ADDERALL) 20 MG tablet 1 tab po bid prn ADD May 2015 rx  60 tablet  0   No current facility-administered medications on file prior to visit.    No Known Allergies  Review of Systems  Review of Systems  Constitutional: Negative for fever and malaise/fatigue.  HENT: Negative for congestion.   Eyes: Negative for discharge.   Respiratory: Negative for shortness of breath.   Cardiovascular: Negative for chest pain, palpitations and leg swelling.  Gastrointestinal: Negative for nausea, abdominal pain and diarrhea.  Genitourinary: Negative for dysuria.  Musculoskeletal: Negative for falls.  Skin: Negative for rash.  Neurological: Negative for loss of consciousness and headaches.  Endo/Heme/Allergies: Negative for polydipsia.  Psychiatric/Behavioral: Negative for depression and suicidal ideas. The patient is not nervous/anxious and does not have insomnia.     Objective  BP 120/72  Pulse 89  Temp(Src) 98.1 F (36.7 C) (Oral)  Ht 5\' 3"  (1.6 m)  Wt 140 lb (63.504 kg)  BMI 24.81 kg/m2  SpO2 97%  LMP 09/21/2013  Physical Exam  Physical Exam  Constitutional: She is oriented to person, place, and time and well-developed, well-nourished, and in no distress. No distress.  HENT:  Head: Normocephalic and atraumatic.  Eyes: Conjunctivae are normal.  Neck: Neck supple. No thyromegaly present.  Cardiovascular: Normal rate, regular rhythm and normal heart sounds.   No murmur heard. Pulmonary/Chest: Effort normal and breath sounds normal. She has no wheezes.  Abdominal: She exhibits no distension and no mass.  Musculoskeletal: She exhibits no edema.  Lymphadenopathy:    She has no cervical adenopathy.  Neurological: She is alert and oriented to person, place, and time.  Skin: Skin is warm and dry. No rash noted. She  is not diaphoretic.  Psychiatric: Memory, affect and judgment normal.      Assessment & Plan  Irregular menses With dysmenorrhea, start Kariva next Sunday after cycle  ADD (attention deficit disorder) Switch to Daytrana at patient request. Return in one monthto reeval. May only use Adderall in pm and use Daytrana in am  Cerumen impaction Otitis externa

## 2013-11-10 ENCOUNTER — Telehealth: Payer: Self-pay

## 2013-11-10 NOTE — Telephone Encounter (Addendum)
Pt contacted office w/question concerning her B/C.(Pt is taking Kariva 0.15-00) Pt would like to know if she can have the dosage upgraded?Also Pt would like to know if she can have her meds mail ordered instead?If so Pt will give info to this when contacted back.

## 2013-11-23 ENCOUNTER — Ambulatory Visit (INDEPENDENT_AMBULATORY_CARE_PROVIDER_SITE_OTHER): Payer: BC Managed Care – PPO | Admitting: Family Medicine

## 2013-11-23 ENCOUNTER — Encounter: Payer: Self-pay | Admitting: Family Medicine

## 2013-11-23 VITALS — BP 110/74 | HR 75 | Temp 98.0°F | Ht 63.0 in | Wt 141.0 lb

## 2013-11-23 DIAGNOSIS — N926 Irregular menstruation, unspecified: Secondary | ICD-10-CM

## 2013-11-23 DIAGNOSIS — Z0000 Encounter for general adult medical examination without abnormal findings: Secondary | ICD-10-CM

## 2013-11-23 DIAGNOSIS — F988 Other specified behavioral and emotional disorders with onset usually occurring in childhood and adolescence: Secondary | ICD-10-CM

## 2013-11-23 NOTE — Patient Instructions (Signed)
Zyrtec/Cetirizine and nasal saline for allergies   Allergies Allergies may happen from anything your body is sensitive to. This may be food, medicines, pollens, chemicals, and nearly anything around you in everyday life that produces allergens. An allergen is anything that causes an allergy producing substance. Heredity is often a factor in causing these problems. This means you may have some of the same allergies as your parents. Food allergies happen in all age groups. Food allergies are some of the most severe and life threatening. Some common food allergies are cow's milk, seafood, eggs, nuts, wheat, and soybeans. SYMPTOMS   Swelling around the mouth.  An itchy red rash or hives.  Vomiting or diarrhea.  Difficulty breathing. SEVERE ALLERGIC REACTIONS ARE LIFE-THREATENING. This reaction is called anaphylaxis. It can cause the mouth and throat to swell and cause difficulty with breathing and swallowing. In severe reactions only a trace amount of food (for example, peanut oil in a salad) may cause death within seconds. Seasonal allergies occur in all age groups. These are seasonal because they usually occur during the same season every year. They may be a reaction to molds, grass pollens, or tree pollens. Other causes of problems are house dust mite allergens, pet dander, and mold spores. The symptoms often consist of nasal congestion, a runny itchy nose associated with sneezing, and tearing itchy eyes. There is often an associated itching of the mouth and ears. The problems happen when you come in contact with pollens and other allergens. Allergens are the particles in the air that the body reacts to with an allergic reaction. This causes you to release allergic antibodies. Through a chain of events, these eventually cause you to release histamine into the blood stream. Although it is meant to be protective to the body, it is this release that causes your discomfort. This is why you were given  anti-histamines to feel better. If you are unable to pinpoint the offending allergen, it may be determined by skin or blood testing. Allergies cannot be cured but can be controlled with medicine. Hay fever is a collection of all or some of the seasonal allergy problems. It may often be treated with simple over-the-counter medicine such as diphenhydramine. Take medicine as directed. Do not drink alcohol or drive while taking this medicine. Check with your caregiver or package insert for child dosages. If these medicines are not effective, there are many new medicines your caregiver can prescribe. Stronger medicine such as nasal spray, eye drops, and corticosteroids may be used if the first things you try do not work well. Other treatments such as immunotherapy or desensitizing injections can be used if all else fails. Follow up with your caregiver if problems continue. These seasonal allergies are usually not life threatening. They are generally more of a nuisance that can often be handled using medicine. HOME CARE INSTRUCTIONS   If unsure what causes a reaction, keep a diary of foods eaten and symptoms that follow. Avoid foods that cause reactions.  If hives or rash are present:  Take medicine as directed.  You may use an over-the-counter antihistamine (diphenhydramine) for hives and itching as needed.  Apply cold compresses (cloths) to the skin or take baths in cool water. Avoid hot baths or showers. Heat will make a rash and itching worse.  If you are severely allergic:  Following a treatment for a severe reaction, hospitalization is often required for closer follow-up.  Wear a medic-alert bracelet or necklace stating the allergy.  You and your family  must learn how to give adrenaline or use an anaphylaxis kit.  If you have had a severe reaction, always carry your anaphylaxis kit or EpiPen with you. Use this medicine as directed by your caregiver if a severe reaction is occurring. Failure  to do so could have a fatal outcome. SEEK MEDICAL CARE IF:  You suspect a food allergy. Symptoms generally happen within 30 minutes of eating a food.  Your symptoms have not gone away within 2 days or are getting worse.  You develop new symptoms.  You want to retest yourself or your child with a food or drink you think causes an allergic reaction. Never do this if an anaphylactic reaction to that food or drink has happened before. Only do this under the care of a caregiver. SEEK IMMEDIATE MEDICAL CARE IF:   You have difficulty breathing, are wheezing, or have a tight feeling in your chest or throat.  You have a swollen mouth, or you have hives, swelling, or itching all over your body.  You have had a severe reaction that has responded to your anaphylaxis kit or an EpiPen. These reactions may return when the medicine has worn off. These reactions should be considered life threatening. MAKE SURE YOU:   Understand these instructions.  Will watch your condition.  Will get help right away if you are not doing well or get worse. Document Released: 07/02/2002 Document Revised: 08/03/2012 Document Reviewed: 12/07/2007 [facility] Patient Information 2015 [place], [place]. This information is not intended to replace advice given to you by your health care provider. Make sure you discuss any questions you have with your health care provider.

## 2013-11-23 NOTE — Progress Notes (Signed)
[name], female   DOB: 02-17-1991, 23 y.o.   MRN: [id number] Katie Tucker [id number] 02-17-1991 11/23/2013      Progress Note-Follow Up  Subjective  Chief Complaint  Chief Complaint  Patient presents with  . Annual Exam    physical    HPI  Patient is a 23 year old female in today for routine medical care. She is doing well. No recent illness, has been tolerating Kariva. No cp/palp/sob/GI c/o. Does note an occasional headache but not notably worse than prior to med changes. Has not tried the Daytrana patch yet. No new concerns. Denies CP/palp/SOB/HA/congestion/fevers/GI or GU c/o. Taking meds as prescribed  Past Medical History  Diagnosis Date  . Chicken pox as a child  . ADD (attention deficit disorder) 4 th grade  . Preventative health care 12/25/2011  . Cerumen impaction 01/29/2013    Past Surgical History  Procedure Laterality Date  . Tympanostomy tube placement      Family History  Problem Relation Age of Onset  . Hyperlipidemia Mother   . Hypertension Father   . Cancer Father 20    testicular  . Cancer Maternal Grandmother     breast- remission  . Hypertension Paternal Grandfather     History   Social History  . Marital Status: Single    Spouse Name: N/A    Number of Children: N/A  . Years of Education: N/A   Occupational History  . Not on file.   Social History Main Topics  . Smoking status: Never Smoker   . Smokeless tobacco: Never Used  . Alcohol Use: No  . Drug Use: No  . Sexual Activity: Yes    Partners: Male   Other Topics Concern  . Not on file   Social History Narrative  . No narrative on file    Current Outpatient Prescriptions on File Prior to Visit  Medication Sig Dispense Refill  . amphetamine-dextroamphetamine (ADDERALL) 20 MG tablet 1 tab po bid prn ADD May 2015 rx  60 tablet  0  . desogestrel-ethinyl estradiol (KARIVA) 0.15-0.02/0.01 MG (21/5) tablet Take 1 tablet by mouth daily.  1 Package  11  .  methylphenidate (DAYTRANA) 15 mg/9hr Place 1 patch (15 mg total) onto the skin daily. wear patch for 9 hours only each day July 2015 rx  30 patch  0  . neomycin-polymyxin-hydrocortisone (CORTISPORIN) otic solution Place 3 drops into both ears at bedtime.  10 mL  0   No current facility-administered medications on file prior to visit.    No Known Allergies  Review of Systems  Review of Systems  Constitutional: Negative for fever, chills and malaise/fatigue.  HENT: Negative for congestion, hearing loss and nosebleeds.   Eyes: Negative for discharge.  Respiratory: Negative for cough, sputum production, shortness of breath and wheezing.   Cardiovascular: Negative for chest pain, palpitations and leg swelling.  Gastrointestinal: Negative for heartburn, nausea, vomiting, abdominal pain, diarrhea, constipation and blood in stool.  Genitourinary: Negative for dysuria, urgency, frequency and hematuria.  Musculoskeletal: Negative for back pain, falls and myalgias.  Skin: Negative for rash.  Neurological: Negative for dizziness, tremors, sensory change, focal weakness, loss of consciousness, weakness and headaches.  Endo/Heme/Allergies: Negative for polydipsia. Does not bruise/bleed easily.  Psychiatric/Behavioral: Negative for depression and suicidal ideas. The patient is not nervous/anxious and does not have insomnia.     Objective  BP 110/74  Pulse 75  Temp(Src) 98 F (36.7 C) (Oral)  Ht 5\' 3"  (1.6 m)  Wt 141  lb (63.957 kg)  BMI 24.98 kg/m2  SpO2 97%  LMP 11/12/2013  Physical Exam  Physical Exam  Constitutional: She is oriented to person, place, and time and well-developed, well-nourished, and in no distress. No distress.  HENT:  Head: Normocephalic and atraumatic.  Right Ear: External ear normal.  Left Ear: External ear normal.  Nose: Nose normal.  Mouth/Throat: Oropharynx is clear and moist. No oropharyngeal exudate.  Eyes: Conjunctivae are normal. Pupils are equal, round,  and reactive to light. Right eye exhibits no discharge. Left eye exhibits no discharge. No scleral icterus.  Neck: Normal range of motion. Neck supple. No thyromegaly present.  Cardiovascular: Normal rate, regular rhythm, normal heart sounds and intact distal pulses.   No murmur heard. Pulmonary/Chest: Effort normal and breath sounds normal. No respiratory distress. She has no wheezes. She has no rales.  Abdominal: Soft. Bowel sounds are normal. She exhibits no distension and no mass. There is no tenderness.  Musculoskeletal: Normal range of motion. She exhibits no edema and no tenderness.  Lymphadenopathy:    She has no cervical adenopathy.  Neurological: She is alert and oriented to person, place, and time. She has normal reflexes. No cranial nerve deficit. Coordination normal.  Skin: Skin is warm and dry. No rash noted. She is not diaphoretic.  Psychiatric: Mood, memory and affect normal.      Assessment & Plan   ADD (attention deficit disorder) Has not tried the Daytrana yet agrees to try it before school starts in case she does not tolerate. Hold Adderall.   Irregular menses Improved with Garnette Scheuermann  Preventative health care Patient encouraged to maintain heart healthy diet, regular exercise, adequate sleep. Consider daily probiotics. Take medications as prescribed. Encouraged avoidance of cigarettes, alcohol drugs. Use seat belts routinely.

## 2013-11-28 NOTE — Assessment & Plan Note (Signed)
Has not tried the Daytrana yet agrees to try it before school starts in case she does not tolerate. Hold Adderall.

## 2013-11-28 NOTE — Assessment & Plan Note (Signed)
Patient encouraged to maintain heart healthy diet, regular exercise, adequate sleep. Consider daily probiotics. Take medications as prescribed. Encouraged avoidance of cigarettes, alcohol drugs. Use seat belts routinely.

## 2013-11-28 NOTE — Assessment & Plan Note (Signed)
Improved with Kariva 

## 2013-12-24 ENCOUNTER — Encounter: Payer: Self-pay | Admitting: Family Medicine

## 2013-12-24 ENCOUNTER — Ambulatory Visit (INDEPENDENT_AMBULATORY_CARE_PROVIDER_SITE_OTHER): Payer: BC Managed Care – PPO | Admitting: Family Medicine

## 2013-12-24 VITALS — BP 100/64 | HR 72 | Temp 98.0°F | Ht 63.0 in | Wt 142.8 lb

## 2013-12-24 DIAGNOSIS — N926 Irregular menstruation, unspecified: Secondary | ICD-10-CM

## 2013-12-24 DIAGNOSIS — F988 Other specified behavioral and emotional disorders with onset usually occurring in childhood and adolescence: Secondary | ICD-10-CM

## 2013-12-24 MED ORDER — METHYLPHENIDATE 20 MG/9HR TD PTCH: 1.0000 | MEDICATED_PATCH | Freq: Every day | TRANSDERMAL | Status: DC

## 2013-12-24 NOTE — Progress Notes (Signed)
[name], female   DOB: [birth year], 23 y.o.   MRN: [id number] Katie Tucker [id number] Dec 17, 1990 12/24/2013      Progress Note-Follow Up  Subjective  Chief Complaint  Chief Complaint  Patient presents with  . Follow-up    5 week    HPI  Patient is a 23 year old female in today for routine medical care. Is in today for followup on medication changes. Tolerating, and it helped partially but not completely it wears off too soon. Denies any worsening headache. Denies any insomnia, anxiety, palpitations or chest pain. No GI or GU concerns noted. Is doing well in school. No shortness of breath or chest pain.  Past Medical History  Diagnosis Date  . Chicken pox as a child  . ADD (attention deficit disorder) 4 th grade  . Preventative health care 12/25/2011  . Cerumen impaction 01/29/2013    Past Surgical History  Procedure Laterality Date  . Tympanostomy tube placement      Family History  Problem Relation Age of Onset  . Hyperlipidemia Mother   . Hypertension Father   . Cancer Father 20    testicular  . Cancer Maternal Grandmother     breast- remission  . Hypertension Paternal Grandfather     History   Social History  . Marital Status: Single    Spouse Name: N/A    Number of Children: N/A  . Years of Education: N/A   Occupational History  . Not on file.   Social History Main Topics  . Smoking status: Never Smoker   . Smokeless tobacco: Never Used  . Alcohol Use: No  . Drug Use: No  . Sexual Activity: Yes    Partners: Male     Comment: lives at home, no dietary restrictions   Other Topics Concern  . Not on file   Social History Narrative  . No narrative on file    Current Outpatient Prescriptions on File Prior to Visit  Medication Sig Dispense Refill  . desogestrel-ethinyl estradiol (KARIVA) 0.15-0.02/0.01 MG (21/5) tablet Take 1 tablet by mouth daily.  1 Package  11  . methylphenidate (DAYTRANA) 15 mg/9hr Place 1 patch (15 mg total)  onto the skin daily. wear patch for 9 hours only each day July 2015 rx  30 patch  0   No current facility-administered medications on file prior to visit.    No Known Allergies  Review of Systems  Review of Systems  Constitutional: Negative for fever and malaise/fatigue.  HENT: Negative for congestion.   Eyes: Negative for discharge.  Respiratory: Negative for shortness of breath.   Cardiovascular: Negative for chest pain, palpitations and leg swelling.  Gastrointestinal: Negative for nausea, abdominal pain and diarrhea.  Genitourinary: Negative for dysuria.  Musculoskeletal: Negative for falls.  Skin: Negative for rash.  Neurological: Positive for headaches. Negative for loss of consciousness.  Endo/Heme/Allergies: Negative for polydipsia.  Psychiatric/Behavioral: Negative for depression and suicidal ideas. The patient is not nervous/anxious and does not have insomnia.     Objective  BP 100/64  Pulse 72  Temp(Src) 98 F (36.7 C) (Oral)  Ht  (1.6 m)  Wt 142 lb 12.8 oz (64.774 kg)  BMI 25.30 kg/m2  SpO2 98%  LMP 12/17/2013  Physical Exam  Physical Exam  Constitutional: She is well-developed, well-nourished, and in no distress. No distress.  HENT:  Left Ear: External ear normal.  Mouth/Throat: No oropharyngeal exudate.  Eyes: EOM are normal. Left eye exhibits no discharge. No scleral icterus.  Neck: No JVD present. No tracheal deviation present.  Cardiovascular: Normal heart sounds and intact distal pulses.   Pulmonary/Chest: No respiratory distress. She has no rales.  Abdominal: She exhibits no distension and no mass. There is tenderness. There is no guarding.  Musculoskeletal: She exhibits no edema and no tenderness.  Lymphadenopathy:    She has no cervical adenopathy.  Skin: No rash noted. No erythema.     Assessment & Plan  ADD (attention deficit disorder) Daytrana tolerated but only partially helpful. Will try increasing to 20 mg patch and reassess  next month, report any concerning symptoms  Irregular menses Still somewhat irregular on Kariva, if still irregular next month may need to change medications

## 2013-12-24 NOTE — Patient Instructions (Signed)
Needs short f/u visit in 4 weeks for medication change.  Needs annual gyn in 3-4 months  Attention Deficit Hyperactivity Disorder Attention deficit hyperactivity disorder (ADHD) is a problem with behavior issues based on the way the brain functions (neurobehavioral disorder). It is a common reason for behavior and academic problems in school. SYMPTOMS  There are 3 types of ADHD. The 3 types and some of the symptoms include:  Inattentive.  Gets bored or distracted easily.  Loses or forgets things. Forgets to hand in homework.  Has trouble organizing or completing tasks.  Difficulty staying on task.  An inability to organize daily tasks and school work.  Leaving projects, chores, or homework unfinished.  Trouble paying attention or responding to details. Careless mistakes.  Difficulty following directions. Often seems like is not listening.  Dislikes activities that require sustained attention (like chores or homework).  Hyperactive-impulsive.  Feels like it is impossible to sit still or stay in a seat. Fidgeting with hands and feet.  Trouble waiting turn.  Talking too much or out of turn. Interruptive.  Speaks or acts impulsively.  Aggressive, disruptive behavior.  Constantly busy or on the go; noisy.  Often leaves seat when they are expected to remain seated.  Often runs or climbs where it is not appropriate, or feels very restless.  Combined.  Has symptoms of both of the above. Often children with ADHD feel discouraged about themselves and with school. They often perform well below their abilities in school. As children get older, the excess motor activities can calm down, but the problems with paying attention and staying organized persist. Most children do not outgrow ADHD but with good treatment can learn to cope with the symptoms. DIAGNOSIS  When ADHD is suspected, the diagnosis should be made by professionals trained in ADHD. This professional will collect  information about the individual suspected of having ADHD. Information must be collected from various settings where the person lives, works, or attends school.  Diagnosis will include:  Confirming symptoms began in childhood.  Ruling out other reasons for the child's behavior.  The health care providers will check with the child's school and check their medical records.  They will talk to teachers and parents.  Behavior rating scales for the child will be filled out by those dealing with the child on a daily basis. A diagnosis is made only after all information has been considered. TREATMENT  Treatment usually includes behavioral treatment, tutoring or extra support in school, and stimulant medicines. Because of the way a person's brain works with ADHD, these medicines decrease impulsivity and hyperactivity and increase attention. This is different than how they would work in a person who does not have ADHD. Other medicines used include antidepressants and certain blood pressure medicines. Most experts agree that treatment for ADHD should address all aspects of the person's functioning. Along with medicines, treatment should include structured classroom management at school. Parents should reward good behavior, provide constant discipline, and set limits. Tutoring should be available for the child as needed. ADHD is a lifelong condition. If untreated, the disorder can have long-term serious effects into adolescence and adulthood. HOME CARE INSTRUCTIONS   Often with ADHD there is a lot of frustration among family members dealing with the condition. Blame and anger are also feelings that are common. In many cases, because the problem affects the family as a whole, the entire family may need help. A therapist can help the family find better ways to handle the disruptive behaviors of  the person with ADHD and promote change. If the person with ADHD is young, most of the therapist's work is with the  parents. Parents will learn techniques for coping with and improving their child's behavior. Sometimes only the child with the ADHD needs counseling. Your health care providers can help you make these decisions.  Children with ADHD may need help learning how to organize. Some helpful tips include:  Keep routines the same every day from wake-up time to bedtime. Schedule all activities, including homework and playtime. Keep the schedule in a place where the person with ADHD will often see it. Mark schedule changes as far in advance as possible.  Schedule outdoor and indoor recreation.  Have a place for everything and keep everything in its place. This includes clothing, backpacks, and school supplies.  Encourage writing down assignments and bringing home needed books. Work with your child's teachers for assistance in organizing school work.  Offer your child a well-balanced diet. Breakfast that includes a balance of whole grains, protein, and fruits or vegetables is especially important for school performance. Children should avoid drinks with caffeine including:  Soft drinks.  Coffee.  Tea.  However, some older children (adolescents) may find these drinks helpful in improving their attention. Because it can also be common for adolescents with ADHD to become addicted to caffeine, talk with your health care provider about what is a safe amount of caffeine intake for your child.  Children with ADHD need consistent rules that they can understand and follow. If rules are followed, give small rewards. Children with ADHD often receive, and expect, criticism. Look for good behavior and praise it. Set realistic goals. Give clear instructions. Look for activities that can foster success and self-esteem. Make time for pleasant activities with your child. Give lots of affection.  Parents are their children's greatest advocates. Learn as much as possible about ADHD. This helps you become a stronger and  better advocate for your child. It also helps you educate your child's teachers and instructors if they feel inadequate in these areas. Parent support groups are often helpful. A national group with local chapters is called Children and Adults with Attention Deficit Hyperactivity Disorder (CHADD). SEEK MEDICAL CARE IF:  Your child has repeated muscle twitches, cough, or speech outbursts.  Your child has sleep problems.  Your child has a marked loss of appetite.  Your child develops depression.  Your child has new or worsening behavioral problems.  Your child develops dizziness.  Your child has a racing heart.  Your child has stomach pains.  Your child develops headaches. SEEK IMMEDIATE MEDICAL CARE IF:  Your child has been diagnosed with depression or anxiety and the symptoms seem to be getting worse.  Your child has been depressed and suddenly appears to have increased energy or motivation.  You are worried that your child is having a bad reaction to a medication he or she is taking for ADHD. Document Released: 03/29/2002 Document Revised: 04/13/2013 Document Reviewed: 12/14/2012 [facility] Patient Information 2015 [place], [place]. This information is not intended to replace advice given to you by your health care provider. Make sure you discuss any questions you have with your health care provider.

## 2013-12-27 NOTE — Assessment & Plan Note (Signed)
Still somewhat irregular on Kariva, if still irregular next month may need to change medications

## 2013-12-27 NOTE — Assessment & Plan Note (Signed)
Daytrana tolerated but only partially helpful. Will try increasing to 20 mg patch and reassess next month, report any concerning symptoms

## 2013-12-30 ENCOUNTER — Telehealth: Payer: Self-pay | Admitting: Family Medicine

## 2013-12-30 DIAGNOSIS — N926 Irregular menstruation, unspecified: Secondary | ICD-10-CM

## 2013-12-30 NOTE — Telephone Encounter (Signed)
Please advise? Can we mail RX's to patients house?

## 2013-12-30 NOTE — Telephone Encounter (Signed)
I assume she means birth control pill rx. We discussed this possibility at her visit. I can mail or e prescribe wherever she wants

## 2013-12-30 NOTE — Telephone Encounter (Signed)
Caller name: Asenath Relation to pt: self Call back number: [phone] Pharmacy:  Reason for call:   Patient is requesting a new bp rx to be mailed to her.

## 2013-12-31 MED ORDER — DESOGESTREL-ETHINYL ESTRADIOL 0.15-0.02/0.01 MG (21/5) PO TABS: 1.0000 | ORAL_TABLET | Freq: Every day | ORAL | Status: DC

## 2013-12-31 NOTE — Telephone Encounter (Signed)
Will print and mail RX

## 2013-12-31 NOTE — Telephone Encounter (Signed)
It is patients birth control pills and she would like rx mailed

## 2014-01-07 ENCOUNTER — Telehealth: Payer: Self-pay | Admitting: Family Medicine

## 2014-01-07 DIAGNOSIS — N926 Irregular menstruation, unspecified: Secondary | ICD-10-CM

## 2014-01-07 NOTE — Telephone Encounter (Addendum)
Caller name: Katie Tucker  Relation to pt: self Call back number: [phone] [facility]acy[facility]   Reason for call:   90 day supply desogestrel-ethinyl estradiol (KARIVA) 0.15-0.02/0.01 MG (21/5) tablet. please send thru mail order.

## 2014-01-10 MED ORDER — DESOGESTREL-ETHINYL ESTRADIOL 0.15-0.02/0.01 MG (21/5) PO TABS: 1.0000 | ORAL_TABLET | Freq: Every day | ORAL | Status: DC

## 2014-01-14 ENCOUNTER — Ambulatory Visit (INDEPENDENT_AMBULATORY_CARE_PROVIDER_SITE_OTHER): Payer: BC Managed Care – PPO | Admitting: Family Medicine

## 2014-01-14 ENCOUNTER — Encounter: Payer: Self-pay | Admitting: Family Medicine

## 2014-01-14 VITALS — BP 116/74 | HR 68 | Temp 98.3°F | Ht 63.0 in | Wt 143.0 lb

## 2014-01-14 DIAGNOSIS — F988 Other specified behavioral and emotional disorders with onset usually occurring in childhood and adolescence: Secondary | ICD-10-CM

## 2014-01-14 MED ORDER — LISDEXAMFETAMINE DIMESYLATE 40 MG PO CAPS: 40.0000 mg | ORAL_CAPSULE | ORAL | Status: DC

## 2014-01-14 NOTE — Patient Instructions (Signed)

## 2014-01-16 ENCOUNTER — Encounter: Payer: Self-pay | Admitting: Family Medicine

## 2014-01-16 NOTE — Assessment & Plan Note (Signed)
daytrana not helpful. Only did low dose vyvanse in past wil try increased dose

## 2014-01-16 NOTE — Progress Notes (Signed)
[name], female   DOB: 1990/10/27, 23 y.o.   MRN: [id number] Katie Tucker [id number] June 11, 1990 01/16/2014      Progress Note-Follow Up  Subjective  Chief Complaint  Chief Complaint  Patient presents with  . Follow-up    4 week on medication change    HPI  Patient is a 23 year old female in today for routine medical care. To be increased back to she's not had a vigorous response. She did not have any side effects notably headaches, palpitations, increased anxiety, insomnia or GI concerns the medication is not helping her get her school date adequately.  Past Medical History  Diagnosis Date  . Chicken pox as a child  . ADD (attention deficit disorder) 4 th grade  . Preventative health care 12/25/2011  . Cerumen impaction 01/29/2013    Past Surgical History  Procedure Laterality Date  . Tympanostomy tube placement      Family History  Problem Relation Age of Onset  . Hyperlipidemia Mother   . Hypertension Father   . Cancer Father 20    testicular  . Cancer Maternal Grandmother     breast- remission  . Hypertension Paternal Grandfather     History   Social History  . Marital Status: Single    Spouse Name: N/A    Number of Children: N/A  . Years of Education: N/A   Occupational History  . Not on file.   Social History Main Topics  . Smoking status: Never Smoker   . Smokeless tobacco: Never Used  . Alcohol Use: No  . Drug Use: No  . Sexual Activity: Yes    Partners: Male     Comment: lives at home, no dietary restrictions   Other Topics Concern  . Not on file   Social History Narrative  . No narrative on file    Current Outpatient Prescriptions on File Prior to Visit  Medication Sig Dispense Refill  . desogestrel-ethinyl estradiol (KARIVA) 0.15-0.02/0.01 MG (21/5) tablet Take 1 tablet by mouth daily.  3 Package  2   No current facility-administered medications on file prior to visit.    No Known Allergies  Review of  Systems  Review of Systems  Constitutional: Negative for fever and malaise/fatigue.  HENT: Negative for congestion.   Eyes: Negative for discharge.  Respiratory: Negative for shortness of breath.   Cardiovascular: Negative for chest pain, palpitations and leg swelling.  Gastrointestinal: Negative for nausea, abdominal pain and diarrhea.  Genitourinary: Negative for dysuria.  Musculoskeletal: Negative for falls.  Skin: Negative for rash.  Neurological: Negative for loss of consciousness and headaches.  Endo/Heme/Allergies: Negative for polydipsia.  Psychiatric/Behavioral: Negative for depression and suicidal ideas. The patient is not nervous/anxious and does not have insomnia.     Objective  BP 116/74  Pulse 68  Temp(Src) 98.3 F (36.8 C) (Oral)  Ht  (1.6 m)  Wt 143 lb (64.864 kg)  BMI 25.34 kg/m2  SpO2 99%  LMP 12/17/2013  Physical Exam  Physical Exam  Constitutional: She is oriented to person, place, and time and well-developed, well-nourished, and in no distress. No distress.  HENT:  Head: Normocephalic and atraumatic.  Eyes: Conjunctivae are normal.  Neck: Neck supple. No thyromegaly present.  Cardiovascular: Normal rate, regular rhythm and normal heart sounds.   No murmur heard. Pulmonary/Chest: Effort normal and breath sounds normal. She has no wheezes.  Abdominal: She exhibits no distension and no mass.  Musculoskeletal: She exhibits no edema.  Lymphadenopathy:  She has no cervical adenopathy.  Neurological: She is alert and oriented to person, place, and time.  Skin: Skin is warm and dry. No rash noted. She is not diaphoretic.  Psychiatric: Memory, affect and judgment normal.      Assessment & Plan  ADD (attention deficit disorder) daytrana not helpful. Only did low dose vyvanse in past wil try increased dose

## 2014-01-21 ENCOUNTER — Ambulatory Visit: Payer: BC Managed Care – PPO | Admitting: Family Medicine

## 2014-02-11 ENCOUNTER — Encounter: Payer: Self-pay | Admitting: Family Medicine

## 2014-02-11 ENCOUNTER — Ambulatory Visit (INDEPENDENT_AMBULATORY_CARE_PROVIDER_SITE_OTHER): Payer: BC Managed Care – PPO | Admitting: Family Medicine

## 2014-02-11 VITALS — BP 103/59 | HR 71 | Temp 98.7°F | Ht 63.0 in | Wt 140.4 lb

## 2014-02-11 DIAGNOSIS — M549 Dorsalgia, unspecified: Secondary | ICD-10-CM

## 2014-02-11 DIAGNOSIS — R51 Headache: Secondary | ICD-10-CM

## 2014-02-11 DIAGNOSIS — R059 Cough, unspecified: Secondary | ICD-10-CM

## 2014-02-11 DIAGNOSIS — R5382 Chronic fatigue, unspecified: Secondary | ICD-10-CM

## 2014-02-11 DIAGNOSIS — Z0000 Encounter for general adult medical examination without abnormal findings: Secondary | ICD-10-CM

## 2014-02-11 DIAGNOSIS — F988 Other specified behavioral and emotional disorders with onset usually occurring in childhood and adolescence: Secondary | ICD-10-CM

## 2014-02-11 DIAGNOSIS — R05 Cough: Secondary | ICD-10-CM

## 2014-02-11 DIAGNOSIS — F909 Attention-deficit hyperactivity disorder, unspecified type: Secondary | ICD-10-CM

## 2014-02-11 DIAGNOSIS — N926 Irregular menstruation, unspecified: Secondary | ICD-10-CM

## 2014-02-11 DIAGNOSIS — R519 Headache, unspecified: Secondary | ICD-10-CM

## 2014-02-11 LAB — RENAL FUNCTION PANEL
ALBUMIN: 3.7 g/dL (ref 3.5–5.2)
BUN: 11 mg/dL (ref 6–23)
CALCIUM: 9.4 mg/dL (ref 8.4–10.5)
CO2: 26 meq/L (ref 19–32)
CREATININE: 0.8 mg/dL (ref 0.4–1.2)
Chloride: 105 mEq/L (ref 96–112)
GFR: 94.84 mL/min (ref >60.00)
Glucose, Bld: 90 mg/dL (ref 70–99)
Phosphorus: 3.1 mg/dL (ref 2.3–4.6)
Potassium: 3.8 mEq/L (ref 3.5–5.1)
Sodium: 137 mEq/L (ref 135–145)

## 2014-02-11 LAB — CBC
HEMATOCRIT: 38.6 % (ref 36.0–46.0)
HEMOGLOBIN: 12.7 g/dL (ref 12.0–15.0)
MCHC: 32.9 g/dL (ref 30.0–36.0)
MCV: 93.8 fl (ref 78.0–100.0)
PLATELETS: 170 10*3/uL (ref 150.0–400.0)
RBC: 4.12 Mil/uL (ref 3.87–5.11)
RDW: 13 % (ref 11.5–15.5)
WBC: 6.5 10*3/uL (ref 4.0–10.5)

## 2014-02-11 LAB — LIPID PANEL
CHOL/HDL RATIO: 4
Cholesterol: 173 mg/dL (ref 0–200)
HDL: 47.2 mg/dL (ref >39.00)
LDL Cholesterol: 111 mg/dL — ABNORMAL HIGH (ref 0–99)
NONHDL: 125.8
Triglycerides: 72 mg/dL (ref 0.0–149.0)
VLDL: 14.4 mg/dL (ref 0.0–40.0)

## 2014-02-11 LAB — HEPATIC FUNCTION PANEL
ALBUMIN: 3.7 g/dL (ref 3.5–5.2)
ALK PHOS: 40 U/L (ref 39–117)
ALT: 13 U/L (ref 0–35)
AST: 17 U/L (ref 0–37)
Bilirubin, Direct: 0.1 mg/dL (ref 0.0–0.3)
Total Bilirubin: 0.9 mg/dL (ref 0.2–1.2)
Total Protein: 7.1 g/dL (ref 6.0–8.3)

## 2014-02-11 LAB — TSH: TSH: 0.98 u[IU]/mL (ref 0.35–4.50)

## 2014-02-11 LAB — MONONUCLEOSIS SCREEN: Mono Screen: NEGATIVE

## 2014-02-11 MED ORDER — LISDEXAMFETAMINE DIMESYLATE 30 MG PO CAPS: 30.0000 mg | ORAL_CAPSULE | Freq: Every day | ORAL | Status: DC

## 2014-02-11 NOTE — Patient Instructions (Signed)
Encouraged increased rest and hydration, add probiotics, zinc such as Coldeze or Xicam. Treat fevers as needed. Elderberry liquidAttention Deficit Hyperactivity Disorder Attention deficit hyperactivity disorder (ADHD) is a problem with behavior issues based on the way the brain functions (neurobehavioral disorder). It is a common reason for behavior and academic problems in school. SYMPTOMS  There are 3 types of ADHD. The 3 types and some of the symptoms include:  Inattentive.  Gets bored or distracted easily.  Loses or forgets things. Forgets to hand in homework.  Has trouble organizing or completing tasks.  Difficulty staying on task.  An inability to organize daily tasks and school work.  Leaving projects, chores, or homework unfinished.  Trouble paying attention or responding to details. Careless mistakes.  Difficulty following directions. Often seems like is not listening.  Dislikes activities that require sustained attention (like chores or homework).  Hyperactive-impulsive.  Feels like it is impossible to sit still or stay in a seat. Fidgeting with hands and feet.  Trouble waiting turn.  Talking too much or out of turn. Interruptive.  Speaks or acts impulsively.  Aggressive, disruptive behavior.  Constantly busy or on the go; noisy.  Often leaves seat when they are expected to remain seated.  Often runs or climbs where it is not appropriate, or feels very restless.  Combined.  Has symptoms of both of the above. Often children with ADHD feel discouraged about themselves and with school. They often perform well below their abilities in school. As children get older, the excess motor activities can calm down, but the problems with paying attention and staying organized persist. Most children do not outgrow ADHD but with good treatment can learn to cope with the symptoms. DIAGNOSIS  When ADHD is suspected, the diagnosis should be made by professionals trained in  ADHD. This professional will collect information about the individual suspected of having ADHD. Information must be collected from various settings where the person lives, works, or attends school.  Diagnosis will include:  Confirming symptoms began in childhood.  Ruling out other reasons for the child's behavior.  The health care providers will check with the child's school and check their medical records.  They will talk to teachers and parents.  Behavior rating scales for the child will be filled out by those dealing with the child on a daily basis. A diagnosis is made only after all information has been considered. TREATMENT  Treatment usually includes behavioral treatment, tutoring or extra support in school, and stimulant medicines. Because of the way a person's brain works with ADHD, these medicines decrease impulsivity and hyperactivity and increase attention. This is different than how they would work in a person who does not have ADHD. Other medicines used include antidepressants and certain blood pressure medicines. Most experts agree that treatment for ADHD should address all aspects of the person's functioning. Along with medicines, treatment should include structured classroom management at school. Parents should reward good behavior, provide constant discipline, and set limits. Tutoring should be available for the child as needed. ADHD is a lifelong condition. If untreated, the disorder can have long-term serious effects into adolescence and adulthood. HOME CARE INSTRUCTIONS   Often with ADHD there is a lot of frustration among family members dealing with the condition. Blame and anger are also feelings that are common. In many cases, because the problem affects the family as a whole, the entire family may need help. A therapist can help the family find better ways to handle the disruptive behaviors of  the person with ADHD and promote change. If the person with ADHD is young, most  of the therapist's work is with the parents. Parents will learn techniques for coping with and improving their child's behavior. Sometimes only the child with the ADHD needs counseling. Your health care providers can help you make these decisions.  Children with ADHD may need help learning how to organize. Some helpful tips include:  Keep routines the same every day from wake-up time to bedtime. Schedule all activities, including homework and playtime. Keep the schedule in a place where the person with ADHD will often see it. Mark schedule changes as far in advance as possible.  Schedule outdoor and indoor recreation.  Have a place for everything and keep everything in its place. This includes clothing, backpacks, and school supplies.  Encourage writing down assignments and bringing home needed books. Work with your child's teachers for assistance in organizing school work.  Offer your child a well-balanced diet. Breakfast that includes a balance of whole grains, protein, and fruits or vegetables is especially important for school performance. Children should avoid drinks with caffeine including:  Soft drinks.  Coffee.  Tea.  However, some older children (adolescents) may find these drinks helpful in improving their attention. Because it can also be common for adolescents with ADHD to become addicted to caffeine, talk with your health care provider about what is a safe amount of caffeine intake for your child.  Children with ADHD need consistent rules that they can understand and follow. If rules are followed, give small rewards. Children with ADHD often receive, and expect, criticism. Look for good behavior and praise it. Set realistic goals. Give clear instructions. Look for activities that can foster success and self-esteem. Make time for pleasant activities with your child. Give lots of affection.  Parents are their children's greatest advocates. Learn as much as possible about ADHD. This  helps you become a stronger and better advocate for your child. It also helps you educate your child's teachers and instructors if they feel inadequate in these areas. Parent support groups are often helpful. A national group with local chapters is called Children and Adults with Attention Deficit Hyperactivity Disorder (CHADD). SEEK MEDICAL CARE IF:  Your child has repeated muscle twitches, cough, or speech outbursts.  Your child has sleep problems.  Your child has a marked loss of appetite.  Your child develops depression.  Your child has new or worsening behavioral problems.  Your child develops dizziness.  Your child has a racing heart.  Your child has stomach pains.  Your child develops headaches. SEEK IMMEDIATE MEDICAL CARE IF:  Your child has been diagnosed with depression or anxiety and the symptoms seem to be getting worse.  Your child has been depressed and suddenly appears to have increased energy or motivation.  You are worried that your child is having a bad reaction to a medication he or she is taking for ADHD. Document Released: 03/29/2002 Document Revised: 04/13/2013 Document Reviewed: 12/14/2012 [facility] Patient Information 2015 [employer], [place]. This information is not intended to replace advice given to you by your health care provider. Make sure you discuss any questions you have with your health care provider.

## 2014-02-11 NOTE — Progress Notes (Signed)
Pre visit review using our clinic review tool, if applicable. No additional management support is needed unless otherwise documented below in the visit note. 

## 2014-02-14 ENCOUNTER — Encounter: Payer: Self-pay | Admitting: Family Medicine

## 2014-02-14 DIAGNOSIS — R059 Cough, unspecified: Secondary | ICD-10-CM | POA: Insufficient documentation

## 2014-02-14 DIAGNOSIS — R05 Cough: Secondary | ICD-10-CM | POA: Insufficient documentation

## 2014-02-14 NOTE — Assessment & Plan Note (Signed)
With mild sore throat, likely viral URI, Encouraged increased rest and hydration, add probiotics, zinc such as Coldeze or Xicam. Treat fevers as needed

## 2014-02-14 NOTE — Assessment & Plan Note (Signed)
Good response to [employer]

## 2014-02-14 NOTE — Assessment & Plan Note (Signed)
Will decrease Vyvanse to 30 mg to see if that helps her HA. Encouraged increased hydration, 64 ounces of clear fluids daily. Minimize alcohol and caffeine. Eat small frequent meals with lean proteins and complex carbs. Avoid high and low blood sugars. Get adequate sleep, 7-8 hours a night. Needs exercise daily preferably in the morning. The Vyvanse is otherwise working well.

## 2014-02-14 NOTE — Progress Notes (Signed)
Patient ID: Katie CarneyKaitlin Tucker, female   DOB: 24-Mar-1991, 23 y.o.   MRN: [id number] Katie Tucker [id number] 24-Mar-1991 02/14/2014      Progress Note-Follow Up  Subjective  Chief Complaint  Chief Complaint  Patient presents with  . Follow-up    2 week    HPI  Patient is a 23 year old female in today for routine medical care. She reports her Vyvanse is helping her concentrate and doing well in school. She is also not feeling well. Notes sort throat, stuffy nose, but no rhinorrhea or f/c. Has malaise and myalgias. Is noting a HA intermittently recently and is questioning if Vyvanse is playing a role. Denies CP/palp/SOB/fevers/GI or GU c/o. Taking meds as prescribed  Past Medical History  Diagnosis Date  . Chicken pox as a child  . ADD (attention deficit disorder) 4 th grade  . Preventative health care 12/25/2011  . Cerumen impaction 01/29/2013    Past Surgical History  Procedure Laterality Date  . Tympanostomy tube placement      Family History  Problem Relation Age of Onset  . Hyperlipidemia Mother   . Hypertension Father   . Cancer Father 20    testicular  . Cancer Maternal Grandmother     breast- remission  . Hypertension Paternal Grandfather     History   Social History  . Marital Status: Single    Spouse Name: N/A    Number of Children: N/A  . Years of Education: N/A   Occupational History  . Not on file.   Social History Main Topics  . Smoking status: Never Smoker   . Smokeless tobacco: Never Used  . Alcohol Use: No  . Drug Use: No  . Sexual Activity: Yes    Partners: Male     Comment: lives at home, no dietary restrictions   Other Topics Concern  . Not on file   Social History Narrative  . No narrative on file    Current Outpatient Prescriptions on File Prior to Visit  Medication Sig Dispense Refill  . desogestrel-ethinyl estradiol (KARIVA) 0.15-0.02/0.01 MG (21/5) tablet Take 1 tablet by mouth daily.  3 Package  2  . lisdexamfetamine (VYVANSE)  40 MG capsule Take 1 capsule (40 mg total) by mouth every morning.  30 capsule  0   No current facility-administered medications on file prior to visit.    No Known Allergies  Review of Systems  Review of Systems  Constitutional: Positive for malaise/fatigue. Negative for fever.  HENT: Positive for congestion and sore throat.   Eyes: Negative for discharge.  Respiratory: Positive for cough. Negative for shortness of breath.   Cardiovascular: Negative for chest pain, palpitations and leg swelling.  Gastrointestinal: Negative for nausea, abdominal pain and diarrhea.  Genitourinary: Negative for dysuria.  Musculoskeletal: Positive for myalgias. Negative for falls.  Skin: Negative for rash.  Neurological: Positive for headaches. Negative for loss of consciousness.  Endo/Heme/Allergies: Negative for polydipsia.  Psychiatric/Behavioral: Negative for depression and suicidal ideas. The patient is not nervous/anxious and does not have insomnia.     Objective  BP 103/59  Pulse 71  Temp(Src) 98.7 F (37.1 C) (Oral)  Ht 5\' 3"  (1.6 m)  Wt 140 lb 6.4 oz (63.685 kg)  BMI 24.88 kg/m2  SpO2 99%  LMP 02/02/2014  Physical Exam  Physical Exam  Constitutional: She is oriented to person, place, and time and well-developed, well-nourished, and in no distress. No distress.  HENT:  Head: Normocephalic and atraumatic.  Eyes: Conjunctivae are normal.  Neck: Neck supple. No thyromegaly present.  Cardiovascular: Normal rate, regular rhythm and normal heart sounds.   No murmur heard. Pulmonary/Chest: Effort normal and breath sounds normal. She has no wheezes.  Abdominal: She exhibits no distension and no mass.  Musculoskeletal: She exhibits no edema.  Lymphadenopathy:    She has no cervical adenopathy.  Neurological: She is alert and oriented to person, place, and time.  Skin: Skin is warm and dry. No rash noted. She is not diaphoretic.  Psychiatric: Memory, affect and judgment normal.     Lab Results  Component Value Date   TSH 0.98 02/11/2014   Lab Results  Component Value Date   WBC 6.5 02/11/2014   HGB 12.7 02/11/2014   HCT 38.6 02/11/2014   MCV 93.8 02/11/2014   PLT 170.0 02/11/2014   Lab Results  Component Value Date   CREATININE 0.8 02/11/2014   BUN 11 02/11/2014   NA 137 02/11/2014   K 3.8 02/11/2014   CL 105 02/11/2014   CO2 26 02/11/2014   Lab Results  Component Value Date   ALT 13 02/11/2014   AST 17 02/11/2014   ALKPHOS 40 02/11/2014   BILITOT 0.9 02/11/2014   Lab Results  Component Value Date   CHOL 173 02/11/2014   Lab Results  Component Value Date   HDL 47.20 02/11/2014   Lab Results  Component Value Date   LDLCALC 111* 02/11/2014   Lab Results  Component Value Date   TRIG 72.0 02/11/2014   Lab Results  Component Value Date   CHOLHDL 4 02/11/2014     Assessment & Plan  Cough With mild sore throat, likely viral URI, Encouraged increased rest and hydration, add probiotics, zinc such as Coldeze or Xicam. Treat fevers as needed  ADD (attention deficit disorder) Will decrease Vyvanse to 30 mg to see if that helps her HA. Encouraged increased hydration, 64 ounces of clear fluids daily. Minimize alcohol and caffeine. Eat small frequent meals with lean proteins and complex carbs. Avoid high and low blood sugars. Get adequate sleep, 7-8 hours a night. Needs exercise daily preferably in the morning. The Vyvanse is otherwise working well.  Irregular menses Good response to Kariva   Back pain Encouraged moist heat and gentle stretching as tolerated. May try NSAIDs and prescription meds as directed and report if symptoms worsen or seek immediate care

## 2014-02-14 NOTE — Assessment & Plan Note (Signed)
Encouraged moist heat and gentle stretching as tolerated. May try NSAIDs and prescription meds as directed and report if symptoms worsen or seek immediate care 

## 2014-03-11 ENCOUNTER — Encounter: Payer: Self-pay | Admitting: Family Medicine

## 2014-03-11 ENCOUNTER — Ambulatory Visit (INDEPENDENT_AMBULATORY_CARE_PROVIDER_SITE_OTHER): Payer: BC Managed Care – PPO | Admitting: Family Medicine

## 2014-03-11 ENCOUNTER — Other Ambulatory Visit (HOSPITAL_COMMUNITY)
Admission: RE | Admit: 2014-03-11 | Discharge: 2014-03-11 | Disposition: A | Discharge status: Home / Self Care | Payer: BC Managed Care – PPO | Source: Ambulatory Visit | Attending: Family Medicine | Admitting: Family Medicine

## 2014-03-11 VITALS — BP 119/83 | HR 76 | Temp 98.3°F | Ht 63.0 in | Wt 136.4 lb

## 2014-03-11 DIAGNOSIS — Z113 Encounter for screening for infections with a predominantly sexual mode of transmission: Secondary | ICD-10-CM | POA: Diagnosis present

## 2014-03-11 DIAGNOSIS — F988 Other specified behavioral and emotional disorders with onset usually occurring in childhood and adolescence: Secondary | ICD-10-CM

## 2014-03-11 DIAGNOSIS — N926 Irregular menstruation, unspecified: Secondary | ICD-10-CM

## 2014-03-11 DIAGNOSIS — R1084 Generalized abdominal pain: Secondary | ICD-10-CM

## 2014-03-11 DIAGNOSIS — F909 Attention-deficit hyperactivity disorder, unspecified type: Secondary | ICD-10-CM

## 2014-03-11 DIAGNOSIS — N760 Acute vaginitis: Secondary | ICD-10-CM | POA: Diagnosis present

## 2014-03-11 MED ORDER — LISDEXAMFETAMINE DIMESYLATE 30 MG PO CAPS: 30.0000 mg | ORAL_CAPSULE | Freq: Every day | ORAL | Status: DC

## 2014-03-11 NOTE — Progress Notes (Signed)
Pre visit review using our clinic review tool, if applicable. No additional management support is needed unless otherwise documented below in the visit note. 

## 2014-03-14 LAB — URINE CYTOLOGY ANCILLARY ONLY
Bacterial vaginitis: NEGATIVE
CHLAMYDIA, DNA PROBE: NEGATIVE
Candida vaginitis: NEGATIVE
Neisseria Gonorrhea: NEGATIVE
Trichomonas: NEGATIVE

## 2014-03-15 ENCOUNTER — Telehealth: Payer: Self-pay | Admitting: Family Medicine

## 2014-03-15 NOTE — Telephone Encounter (Signed)
Caller name: Lupe CarneySides, Ysela Relation to pt: self  Call back number: [phone]   Reason for call:  Pt reviewed current results on MyChart in need of clarification. Please advise

## 2014-03-15 NOTE — Telephone Encounter (Signed)
Do not see resutls on urine pregnancy test

## 2014-03-15 NOTE — Telephone Encounter (Signed)
Please advise? I don't see where there is anything wrong?

## 2014-03-15 NOTE — Telephone Encounter (Signed)
Pt informed but did the pregnancy test come back?

## 2014-03-15 NOTE — Telephone Encounter (Signed)
That is correct all of her results are negative, no concerning infection found

## 2014-03-16 NOTE — Telephone Encounter (Signed)
Spoke with patient and advised per lab results. Advised that urine was not processed for pregnancy test. Patient will come by before 2 today to leave a new urine sample. Advised that it would be no charge to her due to our error.

## 2014-03-17 LAB — URINALYSIS
Bilirubin Urine: NEGATIVE
Glucose, UA: NEGATIVE mg/dL
Hgb urine dipstick: NEGATIVE
Ketones, ur: NEGATIVE mg/dL
LEUKOCYTES UA: NEGATIVE
Nitrite: NEGATIVE
PH: 7 (ref 5.0–8.0)
PROTEIN: NEGATIVE mg/dL
Specific Gravity, Urine: 1.026 (ref 1.005–1.030)
Urobilinogen, UA: 0.2 mg/dL (ref 0.0–1.0)

## 2014-03-17 LAB — PREGNANCY, URINE: PREG TEST UR: NEGATIVE

## 2014-03-18 LAB — URINE CULTURE
COLONY COUNT: NO GROWTH
ORGANISM ID, BACTERIA: NO GROWTH

## 2014-03-20 NOTE — Assessment & Plan Note (Signed)
Given refills on Vyvanse which has been helpful

## 2014-03-20 NOTE — Progress Notes (Signed)
Katie CarneyKaitlin Tucker  [id number] 1990-12-29 03/20/2014      Progress Note-Follow Up  Subjective  Chief Complaint  Chief Complaint  Patient presents with  . Follow-up    4 week    HPI  Patient is a 23 y.o. female in today for routine medical care. Patient is in today for evaluation of irregular menses scant vaginal discharge. Acknowledged active once without barrier method. No fevers or chills. No dysuria or lesions. No change partners. Mild abdominal discomfort but no back pain. Is anxious regarding her current situation but otherwise doing well. ADD medications working well without headaches, chest pains, palpitations or insomnia  Past Medical History  Diagnosis Date  . Chicken pox as a child  . ADD (attention deficit disorder) 4 th grade  . Preventative health care 12/25/2011  . Cerumen impaction 01/29/2013    Past Surgical History  Procedure Laterality Date  . Tympanostomy tube placement      Family History  Problem Relation Age of Onset  . Hyperlipidemia Mother   . Hypertension Father   . Cancer Father 20    testicular  . Cancer Maternal Grandmother     breast- remission  . Hypertension Paternal Grandfather     History   Social History  . Marital Status: Single    Spouse Name: N/A    Number of Children: N/A  . Years of Education: N/A   Occupational History  . Not on file.   Social History Main Topics  . Smoking status: Never Smoker   . Smokeless tobacco: Never Used  . Alcohol Use: No  . Drug Use: No  . Sexual Activity:    Partners: Male     Comment: lives at home, no dietary restrictions   Other Topics Concern  . Not on file   Social History Narrative    Current Outpatient Prescriptions on File Prior to Visit  Medication Sig Dispense Refill  . desogestrel-ethinyl estradiol (KARIVA) 0.15-0.02/0.01 MG (21/5) tablet Take 1 tablet by mouth daily. 3 Package 2   No current facility-administered medications on file prior to visit.    No Known  Allergies  Review of Systems  Review of Systems  Constitutional: Negative for fever and malaise/fatigue.  HENT: Negative for congestion.   Eyes: Negative for discharge.  Respiratory: Negative for shortness of breath.   Cardiovascular: Negative for chest pain, palpitations and leg swelling.  Gastrointestinal: Negative for nausea, abdominal pain and diarrhea.  Genitourinary: Negative for dysuria.  Musculoskeletal: Negative for falls.  Skin: Negative for rash.  Neurological: Negative for loss of consciousness and headaches.  Endo/Heme/Allergies: Negative for polydipsia.  Psychiatric/Behavioral: Negative for depression and suicidal ideas. The patient is nervous/anxious. The patient does not have insomnia.     Objective  BP 119/83 mmHg  Pulse 76  Temp(Src) 98.3 F (36.8 C) (Oral)  Ht 5\' 3"  (1.6 m)  Wt 136 lb 6.4 oz (61.871 kg)  BMI 24.17 kg/m2  SpO2 100%  LMP 01/28/2014  Physical Exam  Physical Exam  Constitutional: She is oriented to person, place, and time and well-developed, well-nourished, and in no distress. No distress.  HENT:  Head: Normocephalic and atraumatic.  Eyes: Conjunctivae are normal.  Neck: Neck supple. No thyromegaly present.  Cardiovascular: Normal rate, regular rhythm and normal heart sounds.   No murmur heard. Pulmonary/Chest: Effort normal and breath sounds normal. She has no wheezes.  Abdominal: She exhibits no distension and no mass.  Musculoskeletal: She exhibits no edema.  Lymphadenopathy:    She has no  cervical adenopathy.  Neurological: She is alert and oriented to person, place, and time.  Skin: Skin is warm and dry. No rash noted. She is not diaphoretic.  Psychiatric: Memory, affect and judgment normal.    Lab Results  Component Value Date   TSH 0.98 02/11/2014   Lab Results  Component Value Date   WBC 6.5 02/11/2014   HGB 12.7 02/11/2014   HCT 38.6 02/11/2014   MCV 93.8 02/11/2014   PLT 170.0 02/11/2014   Lab Results  Component  Value Date   CREATININE 0.8 02/11/2014   BUN 11 02/11/2014   NA 137 02/11/2014   K 3.8 02/11/2014   CL 105 02/11/2014   CO2 26 02/11/2014   Lab Results  Component Value Date   ALT 13 02/11/2014   AST 17 02/11/2014   ALKPHOS 40 02/11/2014   BILITOT 0.9 02/11/2014   Lab Results  Component Value Date   CHOL 173 02/11/2014   Lab Results  Component Value Date   HDL 47.20 02/11/2014   Lab Results  Component Value Date   LDLCALC 111* 02/11/2014   Lab Results  Component Value Date   TRIG 72.0 02/11/2014   Lab Results  Component Value Date   CHOLHDL 4 02/11/2014     Assessment & Plan  Irregular menses And some vaginal discharge will check cultures and continue current OCP for now. Report if no improvement  ADD (attention deficit disorder) Given refills on Vyvanse which has been helpful

## 2014-03-20 NOTE — Assessment & Plan Note (Signed)
And some vaginal discharge will check cultures and continue current OCP for now. Report if no improvement

## 2014-04-29 ENCOUNTER — Other Ambulatory Visit (HOSPITAL_COMMUNITY)
Admission: RE | Admit: 2014-04-29 | Discharge: 2014-04-29 | Disposition: A | Discharge status: Home / Self Care | Payer: BLUE CROSS/BLUE SHIELD | Source: Ambulatory Visit | Attending: Family Medicine | Admitting: Family Medicine

## 2014-04-29 ENCOUNTER — Ambulatory Visit (INDEPENDENT_AMBULATORY_CARE_PROVIDER_SITE_OTHER): Payer: BLUE CROSS/BLUE SHIELD | Admitting: Family Medicine

## 2014-04-29 ENCOUNTER — Encounter: Payer: Self-pay | Admitting: Family Medicine

## 2014-04-29 VITALS — BP 124/78 | HR 90 | Temp 99.1°F | Ht 64.25 in | Wt 138.4 lb

## 2014-04-29 DIAGNOSIS — Z01419 Encounter for gynecological examination (general) (routine) without abnormal findings: Secondary | ICD-10-CM | POA: Diagnosis present

## 2014-04-29 DIAGNOSIS — Z0000 Encounter for general adult medical examination without abnormal findings: Secondary | ICD-10-CM

## 2014-04-29 DIAGNOSIS — F988 Other specified behavioral and emotional disorders with onset usually occurring in childhood and adolescence: Secondary | ICD-10-CM

## 2014-04-29 DIAGNOSIS — Z124 Encounter for screening for malignant neoplasm of cervix: Secondary | ICD-10-CM

## 2014-04-29 DIAGNOSIS — F909 Attention-deficit hyperactivity disorder, unspecified type: Secondary | ICD-10-CM

## 2014-04-29 DIAGNOSIS — N926 Irregular menstruation, unspecified: Secondary | ICD-10-CM

## 2014-04-29 HISTORY — DX: Encounter for screening for malignant neoplasm of cervix: Z12.4

## 2014-04-29 NOTE — Progress Notes (Signed)
Pre visit review using our clinic review tool, if applicable. No additional management support is needed unless otherwise documented below in the visit note. 

## 2014-04-29 NOTE — Patient Instructions (Signed)
Preventive Care for Adults A healthy lifestyle and preventive care can promote health and wellness. Preventive health guidelines for women include the following key practices.  A routine yearly physical is a good way to check with your health care provider about your health and preventive screening. It is a chance to share any concerns and updates on your health and to receive a thorough exam.  Visit your dentist for a routine exam and preventive care every 6 months. Brush your teeth twice a day and floss once a day. Good oral hygiene prevents tooth decay and gum disease.  The frequency of eye exams is based on your age, health, family medical history, use of contact lenses, and other factors. Follow your health care provider's recommendations for frequency of eye exams.  Eat a healthy diet. Foods like vegetables, fruits, whole grains, low-fat dairy products, and lean protein foods contain the nutrients you need without too many calories. Decrease your intake of foods high in solid fats, added sugars, and salt. Eat the right amount of calories for you.Get information about a proper diet from your health care provider, if necessary.  Regular physical exercise is one of the most important things you can do for your health. Most adults should get at least 150 minutes of moderate-intensity exercise (any activity that increases your heart rate and causes you to sweat) each week. In addition, most adults need muscle-strengthening exercises on 2 or more days a week.  Maintain a healthy weight. The body mass index (BMI) is a screening tool to identify possible weight problems. It provides an estimate of body fat based on height and weight. Your health care provider can find your BMI and can help you achieve or maintain a healthy weight.For adults 20 years and older:  A BMI below 18.5 is considered underweight.  A BMI of 18.5 to 24.9 is normal.  A BMI of 25 to 29.9 is considered overweight.  A BMI of  30 and above is considered obese.  Maintain normal blood lipids and cholesterol levels by exercising and minimizing your intake of saturated fat. Eat a balanced diet with plenty of fruit and vegetables. Blood tests for lipids and cholesterol should begin at age 76 and be repeated every 5 years. If your lipid or cholesterol levels are high, you are over 50, or you are at high risk for heart disease, you may need your cholesterol levels checked more frequently.Ongoing high lipid and cholesterol levels should be treated with medicines if diet and exercise are not working.  If you smoke, find out from your health care provider how to quit. If you do not use tobacco, do not start.  Lung cancer screening is recommended for adults aged 22-80 years who are at high risk for developing lung cancer because of a history of smoking. A yearly low-dose CT scan of the lungs is recommended for people who have at least a 30-pack-year history of smoking and are a current smoker or have quit within the past 15 years. A pack year of smoking is smoking an average of 1 pack of cigarettes a day for 1 year (for example: 1 pack a day for 30 years or 2 packs a day for 15 years). Yearly screening should continue until the smoker has stopped smoking for at least 15 years. Yearly screening should be stopped for people who develop a health problem that would prevent them from having lung cancer treatment.  If you are pregnant, do not drink alcohol. If you are breastfeeding,  be very cautious about drinking alcohol. If you are not pregnant and choose to drink alcohol, do not have more than 1 drink per day. One drink is considered to be 12 ounces (355 mL) of beer, 5 ounces (148 mL) of wine, or 1.5 ounces (44 mL) of liquor.  Avoid use of street drugs. Do not share needles with anyone. Ask for help if you need support or instructions about stopping the use of drugs.  High blood pressure causes heart disease and increases the risk of  stroke. Your blood pressure should be checked at least every 1 to 2 years. Ongoing high blood pressure should be treated with medicines if weight loss and exercise do not work.  If you are 75-52 years old, ask your health care provider if you should take aspirin to prevent strokes.  Diabetes screening involves taking a blood sample to check your fasting blood sugar level. This should be done once every 3 years, after age 15, if you are within normal weight and without risk factors for diabetes. Testing should be considered at a younger age or be carried out more frequently if you are overweight and have at least 1 risk factor for diabetes.  Breast cancer screening is essential preventive care for women. You should practice "breast self-awareness." This means understanding the normal appearance and feel of your breasts and may include breast self-examination. Any changes detected, no matter how small, should be reported to a health care provider. Women in their 58s and 30s should have a clinical breast exam (CBE) by a health care provider as part of a regular health exam every 1 to 3 years. After age 16, women should have a CBE every year. Starting at age 53, women should consider having a mammogram (breast X-ray test) every year. Women who have a family history of breast cancer should talk to their health care provider about genetic screening. Women at a high risk of breast cancer should talk to their health care providers about having an MRI and a mammogram every year.  Breast cancer gene (BRCA)-related cancer risk assessment is recommended for women who have family members with BRCA-related cancers. BRCA-related cancers include breast, ovarian, tubal, and peritoneal cancers. Having family members with these cancers may be associated with an increased risk for harmful changes (mutations) in the breast cancer genes BRCA1 and BRCA2. Results of the assessment will determine the need for genetic counseling and  BRCA1 and BRCA2 testing.  Routine pelvic exams to screen for cancer are no longer recommended for nonpregnant women who are considered low risk for cancer of the pelvic organs (ovaries, uterus, and vagina) and who do not have symptoms. Ask your health care provider if a screening pelvic exam is right for you.  If you have had past treatment for cervical cancer or a condition that could lead to cancer, you need Pap tests and screening for cancer for at least 20 years after your treatment. If Pap tests have been discontinued, your risk factors (such as having a new sexual partner) need to be reassessed to determine if screening should be resumed. Some women have medical problems that increase the chance of getting cervical cancer. In these cases, your health care provider may recommend more frequent screening and Pap tests.  The HPV test is an additional test that may be used for cervical cancer screening. The HPV test looks for the virus that can cause the cell changes on the cervix. The cells collected during the Pap test can be  tested for HPV. The HPV test could be used to screen women aged 30 years and older, and should be used in women of any age who have unclear Pap test results. After the age of 30, women should have HPV testing at the same frequency as a Pap test.  Colorectal cancer can be detected and often prevented. Most routine colorectal cancer screening begins at the age of 50 years and continues through age 75 years. However, your health care provider may recommend screening at an earlier age if you have risk factors for colon cancer. On a yearly basis, your health care provider may provide home test kits to check for hidden blood in the stool. Use of a small camera at the end of a tube, to directly examine the colon (sigmoidoscopy or colonoscopy), can detect the earliest forms of colorectal cancer. Talk to your health care provider about this at age 50, when routine screening begins. Direct  exam of the colon should be repeated every 5-10 years through age 75 years, unless early forms of pre-cancerous polyps or small growths are found.  People who are at an increased risk for hepatitis B should be screened for this virus. You are considered at high risk for hepatitis B if:  You were born in a country where hepatitis B occurs often. Talk with your health care provider about which countries are considered high risk.  Your parents were born in a high-risk country and you have not received a shot to protect against hepatitis B (hepatitis B vaccine).  You have HIV or AIDS.  You use needles to inject street drugs.  You live with, or have sex with, someone who has hepatitis B.  You get hemodialysis treatment.  You take certain medicines for conditions like cancer, organ transplantation, and autoimmune conditions.  Hepatitis C blood testing is recommended for all people born from 1945 through 1965 and any individual with known risks for hepatitis C.  Practice safe sex. Use condoms and avoid high-risk sexual practices to reduce the spread of sexually transmitted infections (STIs). STIs include gonorrhea, chlamydia, syphilis, trichomonas, herpes, HPV, and human immunodeficiency virus (HIV). Herpes, HIV, and HPV are viral illnesses that have no cure. They can result in disability, cancer, and death.  You should be screened for sexually transmitted illnesses (STIs) including gonorrhea and chlamydia if:  You are sexually active and are younger than 24 years.  You are older than 24 years and your health care provider tells you that you are at risk for this type of infection.  Your sexual activity has changed since you were last screened and you are at an increased risk for chlamydia or gonorrhea. Ask your health care provider if you are at risk.  If you are at risk of being infected with HIV, it is recommended that you take a prescription medicine daily to prevent HIV infection. This is  called preexposure prophylaxis (PrEP). You are considered at risk if:  You are a heterosexual woman, are sexually active, and are at increased risk for HIV infection.  You take drugs by injection.  You are sexually active with a partner who has HIV.  Talk with your health care provider about whether you are at high risk of being infected with HIV. If you choose to begin PrEP, you should first be tested for HIV. You should then be tested every 3 months for as long as you are taking PrEP.  Osteoporosis is a disease in which the bones lose minerals and strength   with aging. This can result in serious bone fractures or breaks. The risk of osteoporosis can be identified using a bone density scan. Women ages 65 years and over and women at risk for fractures or osteoporosis should discuss screening with their health care providers. Ask your health care provider whether you should take a calcium supplement or vitamin D to reduce the rate of osteoporosis.  Menopause can be associated with physical symptoms and risks. Hormone replacement therapy is available to decrease symptoms and risks. You should talk to your health care provider about whether hormone replacement therapy is right for you.  Use sunscreen. Apply sunscreen liberally and repeatedly throughout the day. You should seek shade when your shadow is shorter than you. Protect yourself by wearing long sleeves, pants, a wide-brimmed hat, and sunglasses year round, whenever you are outdoors.  Once a month, do a whole body skin exam, using a mirror to look at the skin on your back. Tell your health care provider of new moles, moles that have irregular borders, moles that are larger than a pencil eraser, or moles that have changed in shape or color.  Stay current with required vaccines (immunizations).  Influenza vaccine. All adults should be immunized every year.  Tetanus, diphtheria, and acellular pertussis (Td, Tdap) vaccine. Pregnant women should  receive 1 dose of Tdap vaccine during each pregnancy. The dose should be obtained regardless of the length of time since the last dose. Immunization is preferred during the 27th-36th week of gestation. An adult who has not previously received Tdap or who does not know her vaccine status should receive 1 dose of Tdap. This initial dose should be followed by tetanus and diphtheria toxoids (Td) booster doses every 10 years. Adults with an unknown or incomplete history of completing a 3-dose immunization series with Td-containing vaccines should begin or complete a primary immunization series including a Tdap dose. Adults should receive a Td booster every 10 years.  Varicella vaccine. An adult without evidence of immunity to varicella should receive 2 doses or a second dose if she has previously received 1 dose. Pregnant females who do not have evidence of immunity should receive the first dose after pregnancy. This first dose should be obtained before leaving the health care facility. The second dose should be obtained 4-8 weeks after the first dose.  Human papillomavirus (HPV) vaccine. Females aged 13-26 years who have not received the vaccine previously should obtain the 3-dose series. The vaccine is not recommended for use in pregnant females. However, pregnancy testing is not needed before receiving a dose. If a female is found to be pregnant after receiving a dose, no treatment is needed. In that case, the remaining doses should be delayed until after the pregnancy. Immunization is recommended for any person with an immunocompromised condition through the age of 26 years if she did not get any or all doses earlier. During the 3-dose series, the second dose should be obtained 4-8 weeks after the first dose. The third dose should be obtained 24 weeks after the first dose and 16 weeks after the second dose.  Zoster vaccine. One dose is recommended for adults aged 60 years or older unless certain conditions are  present.  Measles, mumps, and rubella (MMR) vaccine. Adults born before 1957 generally are considered immune to measles and mumps. Adults born in 1957 or later should have 1 or more doses of MMR vaccine unless there is a contraindication to the vaccine or there is laboratory evidence of immunity to   each of the three diseases. A routine second dose of MMR vaccine should be obtained at least 28 days after the first dose for students attending postsecondary schools, health care workers, or international travelers. People who received inactivated measles vaccine or an unknown type of measles vaccine during 1963-1967 should receive 2 doses of MMR vaccine. People who received inactivated mumps vaccine or an unknown type of mumps vaccine before 1979 and are at high risk for mumps infection should consider immunization with 2 doses of MMR vaccine. For females of childbearing age, rubella immunity should be determined. If there is no evidence of immunity, females who are not pregnant should be vaccinated. If there is no evidence of immunity, females who are pregnant should delay immunization until after pregnancy. Unvaccinated health care workers born before 1957 who lack laboratory evidence of measles, mumps, or rubella immunity or laboratory confirmation of disease should consider measles and mumps immunization with 2 doses of MMR vaccine or rubella immunization with 1 dose of MMR vaccine.  Pneumococcal 13-valent conjugate (PCV13) vaccine. When indicated, a person who is uncertain of her immunization history and has no record of immunization should receive the PCV13 vaccine. An adult aged 19 years or older who has certain medical conditions and has not been previously immunized should receive 1 dose of PCV13 vaccine. This PCV13 should be followed with a dose of pneumococcal polysaccharide (PPSV23) vaccine. The PPSV23 vaccine dose should be obtained at least 8 weeks after the dose of PCV13 vaccine. An adult aged 19  years or older who has certain medical conditions and previously received 1 or more doses of PPSV23 vaccine should receive 1 dose of PCV13. The PCV13 vaccine dose should be obtained 1 or more years after the last PPSV23 vaccine dose.  Pneumococcal polysaccharide (PPSV23) vaccine. When PCV13 is also indicated, PCV13 should be obtained first. All adults aged 65 years and older should be immunized. An adult younger than age 65 years who has certain medical conditions should be immunized. Any person who resides in a nursing home or long-term care facility should be immunized. An adult smoker should be immunized. People with an immunocompromised condition and certain other conditions should receive both PCV13 and PPSV23 vaccines. People with human immunodeficiency virus (HIV) infection should be immunized as soon as possible after diagnosis. Immunization during chemotherapy or radiation therapy should be avoided. Routine use of PPSV23 vaccine is not recommended for American Indians, [place] Natives, or people younger than 65 years unless there are medical conditions that require PPSV23 vaccine. When indicated, people who have unknown immunization and have no record of immunization should receive PPSV23 vaccine. One-time revaccination 5 years after the first dose of PPSV23 is recommended for people aged 19-64 years who have chronic kidney failure, nephrotic syndrome, asplenia, or immunocompromised conditions. People who received 1-2 doses of PPSV23 before age 65 years should receive another dose of PPSV23 vaccine at age 65 years or later if at least 5 years have passed since the previous dose. Doses of PPSV23 are not needed for people immunized with PPSV23 at or after age 65 years.  Meningococcal vaccine. Adults with asplenia or persistent complement component deficiencies should receive 2 doses of quadrivalent meningococcal conjugate (MenACWY-D) vaccine. The doses should be obtained at least 2 months apart.  Microbiologists working with certain meningococcal bacteria, military recruits, people at risk during an outbreak, and people who travel to or live in countries with a high rate of meningitis should be immunized. A first-year college student up through age   21 years who is living in a residence hall should receive a dose if she did not receive a dose on or after her 16th birthday. Adults who have certain high-risk conditions should receive one or more doses of vaccine.  Hepatitis A vaccine. Adults who wish to be protected from this disease, have certain high-risk conditions, work with hepatitis A-infected animals, work in hepatitis A research labs, or travel to or work in countries with a high rate of hepatitis A should be immunized. Adults who were previously unvaccinated and who anticipate close contact with an international adoptee during the first 60 days after arrival in the [place] from a country with a high rate of hepatitis A should be immunized.  Hepatitis B vaccine. Adults who wish to be protected from this disease, have certain high-risk conditions, may be exposed to blood or other infectious body fluids, are household contacts or sex partners of hepatitis B positive people, are clients or workers in certain care facilities, or travel to or work in countries with a high rate of hepatitis B should be immunized.  Haemophilus influenzae type b (Hib) vaccine. A previously unvaccinated person with asplenia or sickle cell disease or having a scheduled splenectomy should receive 1 dose of Hib vaccine. Regardless of previous immunization, a recipient of a hematopoietic stem cell transplant should receive a 3-dose series 6-12 months after her successful transplant. Hib vaccine is not recommended for adults with HIV infection. Preventive Services / Frequency Ages 64 to 68 years  Blood pressure check.** / Every 1 to 2 years.  Lipid and cholesterol check.** / Every 5 years beginning at age  22.  Clinical breast exam.** / Every 3 years for women in their 88s and 53s.  BRCA-related cancer risk assessment.** / For women who have family members with a BRCA-related cancer (breast, ovarian, tubal, or peritoneal cancers).  Pap test.** / Every 2 years from ages 90 through 51. Every 3 years starting at age 21 through age 56 or 3 with a history of 3 consecutive normal Pap tests.  HPV screening.** / Every 3 years from ages 24 through ages 1 to 46 with a history of 3 consecutive normal Pap tests.  Hepatitis C blood test.** / For any individual with known risks for hepatitis C.  Skin self-exam. / Monthly.  Influenza vaccine. / Every year.  Tetanus, diphtheria, and acellular pertussis (Tdap, Td) vaccine.** / Consult your health care provider. Pregnant women should receive 1 dose of Tdap vaccine during each pregnancy. 1 dose of Td every 10 years.  Varicella vaccine.** / Consult your health care provider. Pregnant females who do not have evidence of immunity should receive the first dose after pregnancy.  HPV vaccine. / 3 doses over 6 months, if 72 and younger. The vaccine is not recommended for use in pregnant females. However, pregnancy testing is not needed before receiving a dose.  Measles, mumps, rubella (MMR) vaccine.** / You need at least 1 dose of MMR if you were born in 1957 or later. You may also need a 2nd dose. For females of childbearing age, rubella immunity should be determined. If there is no evidence of immunity, females who are not pregnant should be vaccinated. If there is no evidence of immunity, females who are pregnant should delay immunization until after pregnancy.  Pneumococcal 13-valent conjugate (PCV13) vaccine.** / Consult your health care provider.  Pneumococcal polysaccharide (PPSV23) vaccine.** / 1 to 2 doses if you smoke cigarettes or if you have certain conditions.  Meningococcal vaccine.** /  1 dose if you are age 19 to 21 years and a first-year college  student living in a residence hall, or have one of several medical conditions, you need to get vaccinated against meningococcal disease. You may also need additional booster doses.  Hepatitis A vaccine.** / Consult your health care provider.  Hepatitis B vaccine.** / Consult your health care provider.  Haemophilus influenzae type b (Hib) vaccine.** / Consult your health care provider. Ages 40 to 64 years  Blood pressure check.** / Every 1 to 2 years.  Lipid and cholesterol check.** / Every 5 years beginning at age 20 years.  Lung cancer screening. / Every year if you are aged 55-80 years and have a 30-pack-year history of smoking and currently smoke or have quit within the past 15 years. Yearly screening is stopped once you have quit smoking for at least 15 years or develop a health problem that would prevent you from having lung cancer treatment.  Clinical breast exam.** / Every year after age 40 years.  BRCA-related cancer risk assessment.** / For women who have family members with a BRCA-related cancer (breast, ovarian, tubal, or peritoneal cancers).  Mammogram.** / Every year beginning at age 40 years and continuing for as long as you are in good health. Consult with your health care provider.  Pap test.** / Every 3 years starting at age 30 years through age 65 or 70 years with a history of 3 consecutive normal Pap tests.  HPV screening.** / Every 3 years from ages 30 years through ages 65 to 70 years with a history of 3 consecutive normal Pap tests.  Fecal occult blood test (FOBT) of stool. / Every year beginning at age 50 years and continuing until age 75 years. You may not need to do this test if you get a colonoscopy every 10 years.  Flexible sigmoidoscopy or colonoscopy.** / Every 5 years for a flexible sigmoidoscopy or every 10 years for a colonoscopy beginning at age 50 years and continuing until age 75 years.  Hepatitis C blood test.** / For all people born from 1945 through  1965 and any individual with known risks for hepatitis C.  Skin self-exam. / Monthly.  Influenza vaccine. / Every year.  Tetanus, diphtheria, and acellular pertussis (Tdap/Td) vaccine.** / Consult your health care provider. Pregnant women should receive 1 dose of Tdap vaccine during each pregnancy. 1 dose of Td every 10 years.  Varicella vaccine.** / Consult your health care provider. Pregnant females who do not have evidence of immunity should receive the first dose after pregnancy.  Zoster vaccine.** / 1 dose for adults aged 60 years or older.  Measles, mumps, rubella (MMR) vaccine.** / You need at least 1 dose of MMR if you were born in 1957 or later. You may also need a 2nd dose. For females of childbearing age, rubella immunity should be determined. If there is no evidence of immunity, females who are not pregnant should be vaccinated. If there is no evidence of immunity, females who are pregnant should delay immunization until after pregnancy.  Pneumococcal 13-valent conjugate (PCV13) vaccine.** / Consult your health care provider.  Pneumococcal polysaccharide (PPSV23) vaccine.** / 1 to 2 doses if you smoke cigarettes or if you have certain conditions.  Meningococcal vaccine.** / Consult your health care provider.  Hepatitis A vaccine.** / Consult your health care provider.  Hepatitis B vaccine.** / Consult your health care provider.  Haemophilus influenzae type b (Hib) vaccine.** / Consult your health care provider. Ages 65   years and over  Blood pressure check.** / Every 1 to 2 years.  Lipid and cholesterol check.** / Every 5 years beginning at age 22 years.  Lung cancer screening. / Every year if you are aged 73-80 years and have a 30-pack-year history of smoking and currently smoke or have quit within the past 15 years. Yearly screening is stopped once you have quit smoking for at least 15 years or develop a health problem that would prevent you from having lung cancer  treatment.  Clinical breast exam.** / Every year after age 4 years.  BRCA-related cancer risk assessment.** / For women who have family members with a BRCA-related cancer (breast, ovarian, tubal, or peritoneal cancers).  Mammogram.** / Every year beginning at age 40 years and continuing for as long as you are in good health. Consult with your health care provider.  Pap test.** / Every 3 years starting at age 9 years through age 34 or 91 years with 3 consecutive normal Pap tests. Testing can be stopped between 65 and 70 years with 3 consecutive normal Pap tests and no abnormal Pap or HPV tests in the past 10 years.  HPV screening.** / Every 3 years from ages 57 years through ages 64 or 45 years with a history of 3 consecutive normal Pap tests. Testing can be stopped between 65 and 70 years with 3 consecutive normal Pap tests and no abnormal Pap or HPV tests in the past 10 years.  Fecal occult blood test (FOBT) of stool. / Every year beginning at age 15 years and continuing until age 17 years. You may not need to do this test if you get a colonoscopy every 10 years.  Flexible sigmoidoscopy or colonoscopy.** / Every 5 years for a flexible sigmoidoscopy or every 10 years for a colonoscopy beginning at age 86 years and continuing until age 71 years.  Hepatitis C blood test.** / For all people born from [birth year] through 1963 and any individual with known risks for hepatitis C.  Osteoporosis screening.** / A one-time screening for women ages 83 years and over and women at risk for fractures or osteoporosis.  Skin self-exam. / Monthly.  Influenza vaccine. / Every year.  Tetanus, diphtheria, and acellular pertussis (Tdap/Td) vaccine.** / 1 dose of Td every 10 years.  Varicella vaccine.** / Consult your health care provider.  Zoster vaccine.** / 1 dose for adults aged 61 years or older.  Pneumococcal 13-valent conjugate (PCV13) vaccine.** / Consult your health care provider.  Pneumococcal  polysaccharide (PPSV23) vaccine.** / 1 dose for all adults aged 28 years and older.  Meningococcal vaccine.** / Consult your health care provider.  Hepatitis A vaccine.** / Consult your health care provider.  Hepatitis B vaccine.** / Consult your health care provider.  Haemophilus influenzae type b (Hib) vaccine.** / Consult your health care provider. ** Family history and personal history of risk and conditions may change your health care provider's recommendations. Document Released: 06/04/2001 Document Revised: 08/23/2013 Document Reviewed: 09/03/2010 [facility] Patient Information 2015 [place], [place]. This information is not intended to replace advice given to you by your health care provider. Make sure you discuss any questions you have with your health care provider.

## 2014-04-29 NOTE — Assessment & Plan Note (Signed)
Menarche at 15 Regular and moderate flow No history of abnormal pap in past, no previous paps G1P1, s/p No history of abnormal MGM No concerns today No gyn surgeries

## 2014-05-02 LAB — CYTOLOGY - PAP

## 2014-05-08 NOTE — Assessment & Plan Note (Signed)
Doing well on current meds without any concerning symptoms. May continue

## 2014-05-08 NOTE — Assessment & Plan Note (Signed)
Patient encouraged to maintain heart healthy diet, regular exercise, adequate sleep. Consider daily probiotics. Take medications as prescribed 

## 2014-05-08 NOTE — Assessment & Plan Note (Signed)
Regular on current OCP, continue same

## 2014-05-08 NOTE — Progress Notes (Signed)
Katie Tucker  [id number] 18-Nov-1990 05/08/2014      Progress Note-Follow Up  Subjective  Chief Complaint  Chief Complaint  Patient presents with  . Annual Exam    pap smear    HPI  Patient is a 24 y.o. female in today for routine medical care. He is in today for annual exam. Feeling well. No recent illness or concerns. No concerning side effects, headaches, anxiety, insomnia or concerns with medications. Denies CP/palp/SOB/HA/congestion/fevers/GI or GU c/o. Taking meds as prescribed  Past Medical History  Diagnosis Date  . Chicken pox as a child  . ADD (attention deficit disorder) 4 th grade  . Preventative health care 12/25/2011  . Cerumen impaction 01/29/2013  . Cervical cancer screening 04/29/2014    Past Surgical History  Procedure Laterality Date  . Tympanostomy tube placement      Family History  Problem Relation Age of Onset  . Hyperlipidemia Mother   . Thyroid disease Mother   . Hypertension Father   . Cancer Father 20    testicular  . Cancer Maternal Grandmother     breast- remission  . Hypertension Paternal Grandfather     History   Social History  . Marital Status: Single    Spouse Name: N/A    Number of Children: N/A  . Years of Education: N/A   Occupational History  . Not on file.   Social History Main Topics  . Smoking status: Never Smoker   . Smokeless tobacco: Never Used  . Alcohol Use: No  . Drug Use: No  . Sexual Activity:    Partners: Male     Comment: lives at home, no dietary restrictions, Junior year in college,  No new partners   Other Topics Concern  . Not on file   Social History Narrative    Current Outpatient Prescriptions on File Prior to Visit  Medication Sig Dispense Refill  . desogestrel-ethinyl estradiol (KARIVA) 0.15-0.02/0.01 MG (21/5) tablet Take 1 tablet by mouth daily. 3 Package 2  . lisdexamfetamine (VYVANSE) 30 MG capsule Take 1 capsule (30 mg total) by mouth daily. RX for 03-13-14 30 capsule 0  .  lisdexamfetamine (VYVANSE) 30 MG capsule Take 1 capsule (30 mg total) by mouth daily. RX for 04-13-14 30 capsule 0  . lisdexamfetamine (VYVANSE) 30 MG capsule Take 1 capsule (30 mg total) by mouth daily. RX for 05-14-14 30 capsule 0   No current facility-administered medications on file prior to visit.    No Known Allergies  Review of Systems  Review of Systems  Constitutional: Negative for fever, chills and malaise/fatigue.  HENT: Negative for congestion, hearing loss and nosebleeds.   Eyes: Negative for discharge.  Respiratory: Negative for cough, sputum production, shortness of breath and wheezing.   Cardiovascular: Negative for chest pain, palpitations and leg swelling.  Gastrointestinal: Negative for heartburn, nausea, vomiting, abdominal pain, diarrhea, constipation and blood in stool.  Genitourinary: Negative for dysuria, urgency, frequency and hematuria.  Musculoskeletal: Negative for myalgias, back pain and falls.  Skin: Negative for rash.  Neurological: Negative for dizziness, tremors, sensory change, focal weakness, loss of consciousness, weakness and headaches.  Endo/Heme/Allergies: Negative for polydipsia. Does not bruise/bleed easily.  Psychiatric/Behavioral: Negative for depression and suicidal ideas. The patient is not nervous/anxious and does not have insomnia.     Objective  BP 124/78 mmHg  Pulse 90  Temp(Src) 99.1 F (37.3 C) (Oral)  Ht 5' 4.25" (1.632 m)  Wt 138 lb 6.4 oz (62.778 kg)  BMI 23.57 kg/m2  SpO2 100%  LMP 04/01/2014  Physical Exam  Physical Exam  Constitutional: She is oriented to person, place, and time and well-developed, well-nourished, and in no distress. No distress.  HENT:  Head: Normocephalic and atraumatic.  Right Ear: External ear normal.  Left Ear: External ear normal.  Nose: Nose normal.  Mouth/Throat: Oropharynx is clear and moist. No oropharyngeal exudate.  Eyes: Conjunctivae are normal. Pupils are equal, round, and reactive to  light. Right eye exhibits no discharge. Left eye exhibits no discharge. No scleral icterus.  Neck: Normal range of motion. Neck supple. No thyromegaly present.  Cardiovascular: Normal rate, regular rhythm, normal heart sounds and intact distal pulses.   No murmur heard. Pulmonary/Chest: Effort normal and breath sounds normal. No respiratory distress. She has no wheezes. She has no rales.  Abdominal: Soft. Bowel sounds are normal. She exhibits no distension and no mass. There is no tenderness.  Musculoskeletal: Normal range of motion. She exhibits no edema or tenderness.  Lymphadenopathy:    She has no cervical adenopathy.  Neurological: She is alert and oriented to person, place, and time. She has normal reflexes. No cranial nerve deficit. Coordination normal.  Skin: Skin is warm and dry. No rash noted. She is not diaphoretic.  Psychiatric: Mood, memory and affect normal.    Lab Results  Component Value Date   TSH 0.98 02/11/2014   Lab Results  Component Value Date   WBC 6.5 02/11/2014   HGB 12.7 02/11/2014   HCT 38.6 02/11/2014   MCV 93.8 02/11/2014   PLT 170.0 02/11/2014   Lab Results  Component Value Date   CREATININE 0.8 02/11/2014   BUN 11 02/11/2014   NA 137 02/11/2014   K 3.8 02/11/2014   CL 105 02/11/2014   CO2 26 02/11/2014   Lab Results  Component Value Date   ALT 13 02/11/2014   AST 17 02/11/2014   ALKPHOS 40 02/11/2014   BILITOT 0.9 02/11/2014   Lab Results  Component Value Date   CHOL 173 02/11/2014   Lab Results  Component Value Date   HDL 47.20 02/11/2014   Lab Results  Component Value Date   LDLCALC 111* 02/11/2014   Lab Results  Component Value Date   TRIG 72.0 02/11/2014   Lab Results  Component Value Date   CHOLHDL 4 02/11/2014     Assessment & Plan  Cervical cancer screening Menarche at 15 Regular and moderate flow No history of abnormal pap in past, no previous paps G1P1, s/p No history of abnormal MGM No concerns today No  gyn surgeries   ADD (attention deficit disorder) Doing well on current meds without any concerning symptoms. May continue    Preventative health care Patient encouraged to maintain heart healthy diet, regular exercise, adequate sleep. Consider daily probiotics. Take medications as prescribed   Irregular menses Regular on current OCP, continue same

## 2014-06-10 ENCOUNTER — Ambulatory Visit (INDEPENDENT_AMBULATORY_CARE_PROVIDER_SITE_OTHER): Payer: BLUE CROSS/BLUE SHIELD | Admitting: Medical

## 2014-06-10 ENCOUNTER — Ambulatory Visit (HOSPITAL_BASED_OUTPATIENT_CLINIC_OR_DEPARTMENT_OTHER)
Admission: RE | Admit: 2014-06-10 | Discharge: 2014-06-10 | Disposition: A | Discharge status: Home / Self Care | Payer: BLUE CROSS/BLUE SHIELD | Source: Ambulatory Visit | Attending: Medical | Admitting: Medical

## 2014-06-10 ENCOUNTER — Encounter: Payer: Self-pay | Admitting: Medical

## 2014-06-10 VITALS — BP 113/75 | HR 73 | Temp 98.3°F | Ht 64.25 in | Wt 142.4 lb

## 2014-06-10 DIAGNOSIS — M25562 Pain in left knee: Secondary | ICD-10-CM

## 2014-06-10 DIAGNOSIS — M25561 Pain in right knee: Secondary | ICD-10-CM | POA: Diagnosis present

## 2014-06-10 DIAGNOSIS — M25569 Pain in unspecified knee: Secondary | ICD-10-CM

## 2014-06-10 MED ORDER — DICLOFENAC SODIUM 75 MG PO TBEC: 75.0000 mg | DELAYED_RELEASE_TABLET | Freq: Two times a day (BID) | ORAL | Status: DC

## 2014-06-10 NOTE — Patient Instructions (Addendum)
Knee pain Pain on both knees. Patellar tendon region. Ice pack may help at end of work day. Rx of diclofenac. Will get xray and evaluate knees. Follow up in 10-14 days or as needed. If pain persisting then may refer to sports medicine. If other arthralgias then get arthritis panel.

## 2014-06-10 NOTE — Progress Notes (Signed)
   Subjective:    [name], female    DOB: 09-18-1990, 24 y.o.   MRN: [id number]  HPI   Pt in with bilateral knee pain. Worse on her rt side. Pt states pain both knees 2-3 wks. No other arthralgias. No prior constant pain. Only states some mild pain when she kneels to both knees at times in the past.  Pt has not been doing any exercises. 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 Pain gets worse throughout the day.  LMP- last week.  During the exam pt points to patella tendon area below patella/tibial tuberosity region.  NO pain during exam.  Late in day after being on her feet rt knee 7-8/10 pain. Lt knee 5/10 pain.   Review of Systems  Constitutional: Negative for fever, chills and fatigue.  Respiratory: Negative for cough, choking, chest tightness and wheezing.   Cardiovascular: Negative for chest pain and palpitations.  Gastrointestinal: Negative for abdominal pain.  Musculoskeletal:       Bilateral knee pain Rt side> than Lt side.  Hematological: Does not bruise/bleed easily.    Past Medical History  Diagnosis Date  . Chicken pox as a child  . ADD (attention deficit disorder) 4 th grade  . Preventative health care 12/25/2011  . Cerumen impaction 01/29/2013  . Cervical cancer screening 04/29/2014    History   Social History  . Marital Status: Single    Spouse Name: N/A  . Number of Children: N/A  . Years of Education: N/A   Occupational History  . Not on file.   Social History Main Topics  . Smoking status: Never Smoker   . Smokeless tobacco: Never Used  . Alcohol Use: No  . Drug Use: No  . Sexual Activity:    Partners: Male     Comment: lives at home, no dietary restrictions, Junior year in college,  No new partners   Other Topics Concern  . Not on file   Social History Narrative    Past Surgical History  Procedure Laterality Date  . Tympanostomy tube placement      Family History  Problem Relation Age of Onset  .  Hyperlipidemia Mother   . Thyroid disease Mother   . Hypertension Father   . Cancer Father 20    testicular  . Cancer Maternal Grandmother     breast- remission  . Hypertension Paternal Grandfather     No Known Allergies  Current Outpatient Prescriptions on File Prior to Visit  Medication Sig Dispense Refill  . desogestrel-ethinyl estradiol (KARIVA) 0.15-0.02/0.01 MG (21/5) tablet Take 1 tablet by mouth daily. 3 Package 2  . lisdexamfetamine (VYVANSE) 30 MG capsule Take 1 capsule (30 mg total) by mouth daily. RX for 03-13-14 30 capsule 0   No current facility-administered medications on file prior to visit.    BP 113/75 mmHg  Pulse 73  Temp(Src) 98.3 F (36.8 C) (Oral)  Ht 5' 4.25" (1.632 m)  Wt 142 lb 6.4 oz (64.592 kg)  BMI 24.25 kg/m2  SpO2 99%  LMP 06/03/2014       Objective:   Physical Exam  General- No acute distress. Pleasant patient. Neck- Full range of motion, no jvd Lungs- Clear, even and unlabored. Heart- regular rate and rhythm. Neurologic- CNII- XII grossly intact.  Knees- bilaterally, both knees are not swollen. Not warm or tender to touch, No crepitus. No instability.       Assessment & Plan:

## 2014-06-10 NOTE — Assessment & Plan Note (Addendum)
Pain on both knees. Patellar tendon region. Ice pack may help at end of work day. Rx of diclofenac. Will get xray and evaluate knees. Follow up in 10-14 days or as needed. If pain persisting then may refer to sports medicine. If other arthralgias then get arthritis panel.

## 2014-06-10 NOTE — Progress Notes (Signed)
Pre visit review using our clinic review tool, if applicable. No additional management support is needed unless otherwise documented below in the visit note. 

## 2014-09-09 ENCOUNTER — Ambulatory Visit: Payer: BC Managed Care – PPO | Admitting: Family Medicine

## 2014-09-18 ENCOUNTER — Other Ambulatory Visit: Payer: Self-pay | Admitting: Family Medicine

## 2014-09-30 ENCOUNTER — Ambulatory Visit: Payer: BLUE CROSS/BLUE SHIELD | Admitting: Family Medicine

## 2014-12-05 ENCOUNTER — Other Ambulatory Visit: Payer: Self-pay | Admitting: Family Medicine

## 2014-12-05 MED ORDER — LISDEXAMFETAMINE DIMESYLATE 30 MG PO CAPS: 30.0000 mg | ORAL_CAPSULE | Freq: Every day | ORAL | Status: DC

## 2014-12-05 NOTE — Telephone Encounter (Signed)
OK to give a one month prescription for the Vyvanse but she needs an appt to get more

## 2014-12-05 NOTE — Telephone Encounter (Signed)
Printed prescription of Vyvanse and called the patient to inform to pickup at the front desk.  She has upcoming appt. The end of August.

## 2014-12-05 NOTE — Telephone Encounter (Signed)
Requesting: Vyvanse Contract  Signed on 01/14/14 UDS  none Last OV 05/09/14 Last Refill  03/11/14  #30 with 0 refills  Next upcoming Appointment 12/16/14  Please Advise

## 2014-12-05 NOTE — Telephone Encounter (Signed)
Caller name: Lataya Relationship to patient: self Can be reached: [phone]  Reason for call: Pt called for refill on Vyvanse last ordered 02/2014. Pt was due to f/u in June but cancelled appt. Pt scheduled appt 12/16/14 and is asking if meds can be filled. Please call pt when RX is ready or to advise.

## 2014-12-16 ENCOUNTER — Ambulatory Visit: Payer: BLUE CROSS/BLUE SHIELD | Admitting: Family Medicine

## 2015-01-11 ENCOUNTER — Telehealth: Payer: Self-pay | Admitting: Family Medicine

## 2015-01-11 NOTE — Telephone Encounter (Signed)
Ph# [phone]  Pt called about vyvanse stating she got the wrong dose. She said she got  and she was taking . She would like call to discuss. Pt has almost a month of the  so it isn't a rush. States that the last time she filled it was .

## 2015-01-12 MED ORDER — LISDEXAMFETAMINE DIMESYLATE 40 MG PO CAPS: 40.0000 mg | ORAL_CAPSULE | ORAL | Status: DC

## 2015-01-12 NOTE — Telephone Encounter (Signed)
Called left message to call back 

## 2015-01-12 NOTE — Telephone Encounter (Signed)
Called the patient and she stated the 40 mg works better for her. Is it ok to do a 3 month supply.

## 2015-01-12 NOTE — Telephone Encounter (Signed)
Patient informed to pickup hardcopy at the front desk and informed of PCP instructions regarding appt./BP.

## 2015-01-12 NOTE — Telephone Encounter (Signed)
Printed Vyvanse for September and October and on counter for signature. Called the patient left message to call back

## 2015-01-12 NOTE — Telephone Encounter (Signed)
No she needs her appt next month to check her bp on this dose, she can have a 30 day supply with 1 rf til seen

## 2015-01-12 NOTE — Telephone Encounter (Signed)
appt is scheduled already for 01/31/15

## 2015-01-12 NOTE — Telephone Encounter (Signed)
Please check what happened, verify she was previously filling 40 mg and provide her with new rx for when she is due for next prescription. Can have 3 month supply if she was given 3 months have her bring back the 2 she has not filled

## 2015-01-31 ENCOUNTER — Encounter: Payer: Self-pay | Admitting: Family Medicine

## 2015-01-31 ENCOUNTER — Ambulatory Visit (INDEPENDENT_AMBULATORY_CARE_PROVIDER_SITE_OTHER): Payer: BLUE CROSS/BLUE SHIELD | Admitting: Family Medicine

## 2015-01-31 VITALS — BP 102/62 | HR 68 | Temp 98.6°F | Ht 64.0 in | Wt 138.2 lb

## 2015-01-31 DIAGNOSIS — F909 Attention-deficit hyperactivity disorder, unspecified type: Secondary | ICD-10-CM | POA: Diagnosis not present

## 2015-01-31 DIAGNOSIS — F988 Other specified behavioral and emotional disorders with onset usually occurring in childhood and adolescence: Secondary | ICD-10-CM

## 2015-01-31 DIAGNOSIS — Z23 Encounter for immunization: Secondary | ICD-10-CM | POA: Diagnosis not present

## 2015-01-31 MED ORDER — LISDEXAMFETAMINE DIMESYLATE 40 MG PO CAPS: 40.0000 mg | ORAL_CAPSULE | ORAL | Status: DC

## 2015-01-31 NOTE — Patient Instructions (Addendum)
Witch Hazel Astringent for cleansing Melatonin 2- 10 mg at bed or L Tryptophan  Tension Headache A tension headache is a feeling of pain, pressure, or aching that is often felt over the front and [place] of the head. The pain can be dull, or it can feel tight (constricting). Tension headaches are not normally associated with nausea or vomiting, and they do not get worse with physical activity. Tension headaches can last from 30 minutes to several days. This is the most common type of headache. CAUSES The exact cause of this condition is not known. Tension headaches often begin after stress, anxiety, or depression. Other triggers may include:  Alcohol.  Too much caffeine, or caffeine withdrawal.  Respiratory infections, such as colds, flu, or sinus infections.  Dental problems or teeth clenching.  Fatigue.  Holding your head and neck in the same position for a long period of time, such as while using a computer.  Smoking. SYMPTOMS Symptoms of this condition include:  A feeling of pressure around the head.  Dull, aching head pain.  Pain felt over the front and [place] of the head.  Tenderness in the muscles of the head, neck, and shoulders. DIAGNOSIS This condition may be diagnosed based on your symptoms and a physical exam. Tests may be done, such as a CT scan or an MRI of your head. These tests may be done if your symptoms are severe or unusual. TREATMENT This condition may be treated with lifestyle changes and medicines to help relieve symptoms. HOME CARE INSTRUCTIONS Managing Pain  Take over-the-counter and prescription medicines only as told by your health care provider.  Lie down in a dark, quiet room when you have a headache.  If directed, apply ice to the head and neck area:  Put ice in a plastic bag.  Place a towel between your skin and the bag.  Leave the ice on for 20 minutes, 2-3 times per day.  Use a heating pad or a hot shower to apply heat to the head and  neck area as told by your health care provider. Eating and Drinking  Eat meals on a regular schedule.  Limit alcohol use.  Decrease your caffeine intake, or stop using caffeine. General Instructions  Keep all follow-up visits as told by your health care provider. This is important.  Keep a headache journal to help find out what may trigger your headaches. For example, write down:  What you eat and drink.  How much sleep you get.  Any change to your diet or medicines.  Try massage or other relaxation techniques.  Limit stress.  Sit up straight, and avoid tensing your muscles.  Do not use tobacco products, including cigarettes, chewing tobacco, or e-cigarettes. If you need help quitting, ask your health care provider.  Exercise regularly as told by your health care provider.  Get 7-9 hours of sleep, or the amount recommended by your health care provider. SEEK MEDICAL CARE IF:  Your symptoms are not helped by medicine.  You have a headache that is different from what you normally experience.  You have nausea or you vomit.  You have a fever. SEEK IMMEDIATE MEDICAL CARE IF:  Your headache becomes severe.  You have repeated vomiting.  You have a stiff neck.  You have a loss of vision.  You have problems with speech.  You have pain in your eye or ear.  You have muscular weakness or loss of muscle control.  You lose your balance or you have trouble  walking.  You feel faint or you pass out.  You have confusion.   This information is not intended to replace advice given to you by your health care provider. Make sure you discuss any questions you have with your health care provider.   Document Released: 04/08/2005 Document Revised: 12/28/2014 Document Reviewed: 08/01/2014 [employer] Interactive Patient Education [employer]

## 2015-02-12 ENCOUNTER — Encounter: Payer: Self-pay | Admitting: Family Medicine

## 2015-02-12 NOTE — Progress Notes (Signed)
Subjective:    [name], female    DOB: 08-26-1990, 24 y.o.   MRN: [id number]  Chief Complaint  Patient presents with  . Follow-up    Vyvanse and Flu shot    HPI Patient is in today for follow up on ADD. Doing well in school, no acute concerns. Denies any HA, palpiitations, insomnia. Does note a mild drop in appetite when taking but compensates with eating at time of administration. Denies CP/palp/SOB/HA/congestion/fevers/GI or GU c/o. Taking meds as prescribed  Past Medical History  Diagnosis Date  . Chicken pox as a child  . ADD (attention deficit disorder) 4 th grade  . Preventative health care 12/25/2011  . Cerumen impaction 01/29/2013  . Cervical cancer screening 04/29/2014    Past Surgical History  Procedure Laterality Date  . Tympanostomy tube placement      Family History  Problem Relation Age of Onset  . Hyperlipidemia Mother   . Thyroid disease Mother   . Hypertension Father   . Cancer Father 20    testicular  . Cancer Maternal Grandmother     breast- remission  . Hypertension Paternal Grandfather     Social History   Social History  . Marital Status: Single    Spouse Name: N/A  . Number of Children: N/A  . Years of Education: N/A   Occupational History  . Not on file.   Social History Main Topics  . Smoking status: Never Smoker   . Smokeless tobacco: Never Used  . Alcohol Use: No  . Drug Use: No  . Sexual Activity:    Partners: Male     Comment: lives at home, no dietary restrictions, Junior year in college,  No new partners   Other Topics Concern  . Not on file   Social History Narrative    Outpatient Prescriptions Prior to Visit  Medication Sig Dispense Refill  . diclofenac (VOLTAREN) 75 MG EC tablet Take 1 tablet (75 mg total) by mouth 2 (two) times daily. 30 tablet 0  . VIORELE 0.15-0.02/0.01 MG (21/5) tablet TAKE 1 TABLET DAILY 84 tablet 1  . lisdexamfetamine (VYVANSE) 40 MG capsule Take 1 capsule (40 mg total) by  mouth every morning. 30 capsule 0   No facility-administered medications prior to visit.    No Known Allergies  Review of Systems  Constitutional: Negative for fever and malaise/fatigue.  Respiratory: Negative for shortness of breath.   Cardiovascular: Negative for chest pain and palpitations.  Gastrointestinal: Negative for nausea and abdominal pain.  Genitourinary: Negative for dysuria.  Neurological: Negative for headaches.  Psychiatric/Behavioral: Negative for depression. The patient is not nervous/anxious.        Objective:    Physical Exam  Constitutional: She is oriented to person, place, and time. She appears well-developed and well-nourished. No distress.  HENT:  Head: Normocephalic and atraumatic.  Nose: Nose normal.  Eyes: Right eye exhibits no discharge. Left eye exhibits no discharge.  Neck: Normal range of motion. Neck supple.  Cardiovascular: Normal rate and regular rhythm.   No murmur heard. Pulmonary/Chest: Effort normal and breath sounds normal.  Abdominal: Soft. Bowel sounds are normal. There is no tenderness.  Musculoskeletal: She exhibits no edema.  Neurological: She is alert and oriented to person, place, and time.  Skin: Skin is warm and dry.  Psychiatric: She has a normal mood and affect.  Nursing note and vitals reviewed.   BP 102/62 mmHg  Pulse 68  Temp(Src) 98.6 F (37 C) (Oral)  Ht  5\' 4"  (1.626 m)  Wt 138 lb 4 oz (62.71 kg)  BMI 23.72 kg/m2  SpO2 97%  LMP 01/05/2015 Wt Readings from Last 3 Encounters:  01/31/15 138 lb 4 oz (62.71 kg)  06/10/14 142 lb 6.4 oz (64.592 kg)  04/29/14 138 lb 6.4 oz (62.778 kg)     Lab Results  Component Value Date   WBC 6.5 02/11/2014   HGB 12.7 02/11/2014   HCT 38.6 02/11/2014   PLT 170.0 02/11/2014   GLUCOSE 90 02/11/2014   CHOL 173 02/11/2014   TRIG 72.0 02/11/2014   HDL 47.20 02/11/2014   LDLCALC 111* 02/11/2014   ALT 13 02/11/2014   AST 17 02/11/2014   NA 137 02/11/2014   K 3.8 02/11/2014     CL 105 02/11/2014   CREATININE 0.8 02/11/2014   BUN 11 02/11/2014   CO2 26 02/11/2014   TSH 0.98 02/11/2014    Lab Results  Component Value Date   TSH 0.98 02/11/2014   Lab Results  Component Value Date   WBC 6.5 02/11/2014   HGB 12.7 02/11/2014   HCT 38.6 02/11/2014   MCV 93.8 02/11/2014   PLT 170.0 02/11/2014   Lab Results  Component Value Date   NA 137 02/11/2014   K 3.8 02/11/2014   CO2 26 02/11/2014   GLUCOSE 90 02/11/2014   BUN 11 02/11/2014   CREATININE 0.8 02/11/2014   BILITOT 0.9 02/11/2014   ALKPHOS 40 02/11/2014   AST 17 02/11/2014   ALT 13 02/11/2014   PROT 7.1 02/11/2014   ALBUMIN 3.7 02/11/2014   ALBUMIN 3.7 02/11/2014   CALCIUM 9.4 02/11/2014   GFR 94.84 02/11/2014   Lab Results  Component Value Date   CHOL 173 02/11/2014   Lab Results  Component Value Date   HDL 47.20 02/11/2014   Lab Results  Component Value Date   LDLCALC 111* 02/11/2014   Lab Results  Component Value Date   TRIG 72.0 02/11/2014   Lab Results  Component Value Date   CHOLHDL 4 02/11/2014   No results found for: HGBA1C     Assessment & Plan:   Problem List Items Addressed This Visit    ADD (attention deficit disorder)    Given refills on Vyvanse, continues to work well without concerning side effects       Other Visit Diagnoses    Encounter for immunization    -  Primary       I am having Ms. Bove start on lisdexamfetamine. I am also having her maintain her diclofenac, VIORELE, and lisdexamfetamine.  Meds ordered this encounter  Medications  . lisdexamfetamine (VYVANSE) 40 MG capsule    Sig: Take 1 capsule (40 mg total) by mouth every morning.    Dispense:  30 capsule    Refill:  0    November 2016  . lisdexamfetamine (VYVANSE) 40 MG capsule    Sig: Take 1 capsule (40 mg total) by mouth every morning. December 2016    Dispense:  30 capsule    Refill:  0     Danise EdgeBLYTH, STACEY, MD

## 2015-02-12 NOTE — Assessment & Plan Note (Signed)
Given refills on Vyvanse, continues to work well without concerning side effects

## 2015-03-05 ENCOUNTER — Other Ambulatory Visit: Payer: Self-pay | Admitting: Family Medicine

## 2015-05-26 ENCOUNTER — Other Ambulatory Visit: Payer: Self-pay | Admitting: Family Medicine

## 2015-06-21 ENCOUNTER — Ambulatory Visit (INDEPENDENT_AMBULATORY_CARE_PROVIDER_SITE_OTHER): Payer: BLUE CROSS/BLUE SHIELD

## 2015-06-21 ENCOUNTER — Telehealth: Payer: Self-pay | Admitting: Family Medicine

## 2015-06-21 DIAGNOSIS — Z23 Encounter for immunization: Secondary | ICD-10-CM

## 2015-06-21 NOTE — Telephone Encounter (Signed)
°  Relation to ZO:XWRU Call back [phone] Pharmacy:  Reason for call: pt is needing rx lisdexamfetamine (VYVANSE) 40 MG capsule , please call when available for pick up

## 2015-06-21 NOTE — Progress Notes (Signed)
Pre visit review using our clinic review tool, if applicable. No additional management support is needed unless otherwise documented below in the visit note.   Patient came in today per Dr. Abner Greenspan for ppd injection. Placed in left forearm . Patient tolerated well.No complaints voiced. Agreed to return on Wednesday to have read.

## 2015-06-21 NOTE — Telephone Encounter (Signed)
OK to write her rx for Vyvanse, March, April and May prescriptions

## 2015-06-21 NOTE — Telephone Encounter (Signed)
Last filled:  11/16 Amt: 30, 0 Last OV:  01/31/15 [facility] contract on file No UDS Please advise

## 2015-06-22 MED ORDER — LISDEXAMFETAMINE DIMESYLATE 40 MG PO CAPS: 40.0000 mg | ORAL_CAPSULE | ORAL | Status: DC

## 2015-06-22 NOTE — Telephone Encounter (Signed)
Printed prescriptions as instructed, called the patient informed once PCP signs will put at the front and she may pickup at her convenience.

## 2015-06-23 DIAGNOSIS — Z23 Encounter for immunization: Secondary | ICD-10-CM | POA: Diagnosis not present

## 2015-06-23 LAB — TB SKIN TEST
Induration: 0 mm
TB Skin Test: NEGATIVE

## 2015-07-12 ENCOUNTER — Encounter: Payer: Self-pay | Admitting: Behavioral Health

## 2015-07-12 ENCOUNTER — Telehealth: Payer: Self-pay | Admitting: Behavioral Health

## 2015-07-12 NOTE — Telephone Encounter (Signed)
Pre-Visit Call completed with patient and chart updated.   Pre-Visit Info documented in Specialty Comments under [employer].    

## 2015-07-13 ENCOUNTER — Ambulatory Visit (INDEPENDENT_AMBULATORY_CARE_PROVIDER_SITE_OTHER): Payer: BLUE CROSS/BLUE SHIELD | Admitting: Family Medicine

## 2015-07-13 ENCOUNTER — Encounter: Payer: Self-pay | Admitting: Family Medicine

## 2015-07-13 VITALS — BP 112/64 | HR 79 | Temp 98.3°F | Ht 64.0 in | Wt 136.5 lb

## 2015-07-13 DIAGNOSIS — N926 Irregular menstruation, unspecified: Secondary | ICD-10-CM

## 2015-07-13 DIAGNOSIS — F988 Other specified behavioral and emotional disorders with onset usually occurring in childhood and adolescence: Secondary | ICD-10-CM

## 2015-07-13 DIAGNOSIS — F909 Attention-deficit hyperactivity disorder, unspecified type: Secondary | ICD-10-CM

## 2015-07-13 DIAGNOSIS — L309 Dermatitis, unspecified: Secondary | ICD-10-CM | POA: Diagnosis not present

## 2015-07-13 NOTE — Progress Notes (Signed)
Pre visit review using our clinic review tool, if applicable. No additional management support is needed unless otherwise documented below in the visit note. 

## 2015-07-13 NOTE — Patient Instructions (Addendum)
NOW company probiotic 10 strain cap one daily can get at [employer] or order on line at [email or web address] Witch Hazel 2 x daily   Contact Dermatitis Dermatitis is redness, soreness, and swelling (inflammation) of the skin. Contact dermatitis is a reaction to certain substances that touch the skin. There are two types of contact dermatitis:   Irritant contact dermatitis. This type is caused by something that irritates your skin, such as dry hands from washing them too much. This type does not require previous exposure to the substance for a reaction to occur. This type is more common.  Allergic contact dermatitis. This type is caused by a substance that you are allergic to, such as a nickel allergy or poison ivy. This type only occurs if you have been exposed to the substance (allergen) before. Upon a repeat exposure, your body reacts to the substance. This type is less common. CAUSES  Many different substances can cause contact dermatitis. Irritant contact dermatitis is most commonly caused by exposure to:   Makeup.   Soaps.   Detergents.   Bleaches.   Acids.   Metal salts, such as nickel.  Allergic contact dermatitis is most commonly caused by exposure to:   Poisonous plants.   Chemicals.   Jewelry.   Latex.   Medicines.   Preservatives in products, such as clothing.  RISK FACTORS This condition is more likely to develop in:   People who have jobs that expose them to irritants or allergens.  People who have certain medical conditions, such as asthma or eczema.  SYMPTOMS  Symptoms of this condition may occur anywhere on your body where the irritant has touched you or is touched by you. Symptoms include:  Dryness or flaking.   Redness.   Cracks.   Itching.   Pain or a burning feeling.   Blisters.  Drainage of small amounts of blood or clear fluid from skin cracks. With allergic contact dermatitis, there may also be swelling in  areas such as the eyelids, mouth, or genitals.  DIAGNOSIS  This condition is diagnosed with a medical history and physical exam. A patch skin test may be performed to help determine the cause. If the condition is related to your job, you may need to see an occupational medicine specialist. TREATMENT Treatment for this condition includes figuring out what caused the reaction and protecting your skin from further contact. Treatment may also include:   Steroid creams or ointments. Oral steroid medicines may be needed in more severe cases.  Antibiotics or antibacterial ointments, if a skin infection is present.  Antihistamine lotion or an antihistamine taken by mouth to ease itching.  A bandage (dressing). HOME CARE INSTRUCTIONS Skin Care  Moisturize your skin as needed.   Apply cool compresses to the affected areas.  Try taking a bath with:  Epsom salts. Follow the instructions on the packaging. You can get these at your local pharmacy or grocery store.  Baking soda. Pour a small amount into the bath as directed by your health care provider.  Colloidal oatmeal. Follow the instructions on the packaging. You can get this at your local pharmacy or grocery store.  Try applying baking soda paste to your skin. Stir water into baking soda until it reaches a paste-like consistency.  Do not scratch your skin.  Bathe less frequently, such as every other day.  Bathe in lukewarm water. Avoid using hot water. Medicines  Take or apply over-the-counter and prescription medicines only as told by your health care  provider.   If you were prescribed an antibiotic medicine, take or apply your antibiotic as told by your health care provider. Do not stop using the antibiotic even if your condition starts to improve. General Instructions  Keep all follow-up visits as told by your health care provider. This is important.  Avoid the substance that caused your reaction. If you do not know what  caused it, keep a journal to try to track what caused it. Write down:  What you eat.  What cosmetic products you use.  What you drink.  What you wear in the affected area. This includes jewelry.  If you were given a dressing, take care of it as told by your health care provider. This includes when to change and remove it. SEEK MEDICAL CARE IF:   Your condition does not improve with treatment.  Your condition gets worse.  You have signs of infection such as swelling, tenderness, redness, soreness, or warmth in the affected area.  You have a fever.  You have new symptoms. SEEK IMMEDIATE MEDICAL CARE IF:   You have a severe headache, neck pain, or neck stiffness.  You vomit.  You feel very sleepy.  You notice red streaks coming from the affected area.  Your bone or joint underneath the affected area becomes painful after the skin has healed.  The affected area turns darker.  You have difficulty breathing.   This information is not intended to replace advice given to you by your health care provider. Make sure you discuss any questions you have with your health care provider.   Document Released: 04/05/2000 Document Revised: 12/28/2014 Document Reviewed: 08/24/2014 Elsevier Interactive Patient Education [employer]2016 Elsevier Inc.

## 2015-07-16 ENCOUNTER — Encounter: Payer: Self-pay | Admitting: Family Medicine

## 2015-07-16 DIAGNOSIS — L309 Dermatitis, unspecified: Secondary | ICD-10-CM

## 2015-07-16 HISTORY — DX: Dermatitis, unspecified: L30.9

## 2015-07-16 NOTE — Assessment & Plan Note (Signed)
Given refills today, dong well.

## 2015-07-16 NOTE — Assessment & Plan Note (Signed)
Doing well on OCP

## 2015-07-16 NOTE — Progress Notes (Signed)
[name] Olliff, female   DOB: 11-26-1990, 25 y.o.   MRN: [id number]   Subjective:    [name] Beshara, female    DOB: 11-26-1990, 25 y.o.   MRN: [id number]  Chief Complaint  Patient presents with  . Annual Exam    HPI Patient is in today for follow up. She is doing welll. No acute concerns. No recent illness. Denies any insomnia, anxiety. Has struggled with some pruritus in axilla but it is better today, no rash. Denies CP/palp/SOB/HA/congestion/fevers/GI or GU c/o. Taking meds as prescribed  Past Medical History  Diagnosis Date  . Chicken pox as a child  . ADD (attention deficit disorder) 4 th grade  . Preventative health care 12/25/2011  . Cerumen impaction 01/29/2013  . Cervical cancer screening 04/29/2014  . Dermatitis 07/16/2015    Past Surgical History  Procedure Laterality Date  . Tympanostomy tube placement      Family History  Problem Relation Age of Onset  . Hyperlipidemia Mother   . Thyroid disease Mother   . Hypertension Father   . Cancer Father 20    testicular  . Cancer Maternal Grandmother     breast- remission; lung cancer  . Stroke Maternal Grandmother   . Hypertension Paternal Grandfather   . Learning disabilities Sister     Social History   Social History  . Marital Status: Single    Spouse Name: N/A  . Number of Children: N/A  . Years of Education: N/A   Occupational History  . Not on file.   Social History Main Topics  . Smoking status: Never Smoker   . Smokeless tobacco: Never Used  . Alcohol Use: No  . Drug Use: No  . Sexual Activity:    Partners: Male     Comment: lives at home, no dietary restrictions, Junior year in college,  No new partners   Other Topics Concern  . Not on file   Social History Narrative    Outpatient Prescriptions Prior to Visit  Medication Sig Dispense Refill  . lisdexamfetamine (VYVANSE) 40 MG capsule Take 1 capsule (40 mg total) by mouth every morning. March 2017 30 capsule 0  .  lisdexamfetamine (VYVANSE) 40 MG capsule Take 1 capsule (40 mg total) by mouth every morning. April 2017 30 capsule 0  . lisdexamfetamine (VYVANSE) 40 MG capsule Take 1 capsule (40 mg total) by mouth every morning. May 2017 30 capsule 0  . VIORELE 0.15-0.02/0.01 MG (21/5) tablet TAKE 1 TABLET DAILY 84 tablet 0   No facility-administered medications prior to visit.    No Known Allergies  Review of Systems  Constitutional: Negative for fever and malaise/fatigue.  HENT: Negative for congestion.   Eyes: Negative for blurred vision.  Respiratory: Negative for shortness of breath.   Cardiovascular: Negative for chest pain, palpitations and leg swelling.  Gastrointestinal: Negative for nausea, abdominal pain and blood in stool.  Genitourinary: Negative for dysuria and frequency.  Musculoskeletal: Negative for falls.  Skin: Negative for rash.  Neurological: Negative for dizziness, loss of consciousness and headaches.  Endo/Heme/Allergies: Negative for environmental allergies.  Psychiatric/Behavioral: Negative for depression. The patient is not nervous/anxious.        Objective:    Physical Exam  Constitutional: She is oriented to person, place, and time. She appears well-developed and well-nourished. No distress.  HENT:  Head: Normocephalic and atraumatic.  Nose: Nose normal.  Eyes: Right eye exhibits no discharge. Left eye exhibits no discharge.  Neck: Normal range of motion. Neck  supple.  Cardiovascular: Normal rate and regular rhythm.   No murmur heard. Pulmonary/Chest: Effort normal and breath sounds normal.  Abdominal: Soft. Bowel sounds are normal. There is no tenderness.  Musculoskeletal: She exhibits no edema.  Neurological: She is alert and oriented to person, place, and time.  Skin: Skin is warm and dry.  Psychiatric: She has a normal mood and affect.  Nursing note and vitals reviewed.   BP 112/64 mmHg  Pulse 79  Temp(Src) 98.3 F (36.8 C) (Oral)  Ht  (1.626 m)   Wt 136 lb 8 oz (61.916 kg)  BMI 23.42 kg/m2  SpO2 97% Wt Readings from Last 3 Encounters:  07/13/15 136 lb 8 oz (61.916 kg)  01/31/15 138 lb 4 oz (62.71 kg)  06/10/14 142 lb 6.4 oz (64.592 kg)     Lab Results  Component Value Date   WBC 6.5 02/11/2014   HGB 12.7 02/11/2014   HCT 38.6 02/11/2014   PLT 170.0 02/11/2014   GLUCOSE 90 02/11/2014   CHOL 173 02/11/2014   TRIG 72.0 02/11/2014   HDL 47.20 02/11/2014   LDLCALC 111* 02/11/2014   ALT 13 02/11/2014   AST 17 02/11/2014   NA 137 02/11/2014   K 3.8 02/11/2014   CL 105 02/11/2014   CREATININE 0.8 02/11/2014   BUN 11 02/11/2014   CO2 26 02/11/2014   TSH 0.98 02/11/2014    Lab Results  Component Value Date   TSH 0.98 02/11/2014   Lab Results  Component Value Date   WBC 6.5 02/11/2014   HGB 12.7 02/11/2014   HCT 38.6 02/11/2014   MCV 93.8 02/11/2014   PLT 170.0 02/11/2014   Lab Results  Component Value Date   NA 137 02/11/2014   K 3.8 02/11/2014   CO2 26 02/11/2014   GLUCOSE 90 02/11/2014   BUN 11 02/11/2014   CREATININE 0.8 02/11/2014   BILITOT 0.9 02/11/2014   ALKPHOS 40 02/11/2014   AST 17 02/11/2014   ALT 13 02/11/2014   PROT 7.1 02/11/2014   ALBUMIN 3.7 02/11/2014   ALBUMIN 3.7 02/11/2014   CALCIUM 9.4 02/11/2014   GFR 94.84 02/11/2014   Lab Results  Component Value Date   CHOL 173 02/11/2014   Lab Results  Component Value Date   HDL 47.20 02/11/2014   Lab Results  Component Value Date   LDLCALC 111* 02/11/2014   Lab Results  Component Value Date   TRIG 72.0 02/11/2014   Lab Results  Component Value Date   CHOLHDL 4 02/11/2014   No results found for: HGBA1C     Assessment & Plan:   Problem List Items Addressed This Visit    ADD (attention deficit disorder)    Given refills today, dong well.      Dermatitis - Primary    Underarms, mild pruritus but improved with Witch Hazel Astringent.       Irregular menses    Doing well on OCP         I am having Ms. Waldeck  maintain her VIORELE, lisdexamfetamine, lisdexamfetamine, and lisdexamfetamine.  No orders of the defined types were placed in this encounter.     Danise Edge, MD

## 2015-07-16 NOTE — Assessment & Plan Note (Signed)
Underarms, mild pruritus but improved with [employer] Astringent.

## 2015-08-16 ENCOUNTER — Other Ambulatory Visit: Payer: Self-pay | Admitting: Family Medicine

## 2015-09-28 ENCOUNTER — Telehealth: Payer: Self-pay | Admitting: Family Medicine

## 2015-09-28 MED ORDER — DESOGESTREL-ETHINYL ESTRADIOL 0.15-0.02/0.01 MG (21/5) PO TABS: 1.0000 | ORAL_TABLET | Freq: Every day | ORAL | Status: DC

## 2015-09-28 NOTE — Telephone Encounter (Signed)
Pt called in to check to see if she will need an appt in order to receive a refill on her birth control pills. (VIORELE) Pt says that she noticed that she only had 1 refill. Please advise for scheduling.     Thanks.   CB: [phone]

## 2015-09-28 NOTE — Telephone Encounter (Signed)
[facility]  Patient informed as has been seen in march 2017.

## 2015-11-21 ENCOUNTER — Other Ambulatory Visit: Payer: Self-pay | Admitting: Family Medicine

## 2015-11-21 MED ORDER — LISDEXAMFETAMINE DIMESYLATE 40 MG PO CAPS: 40.0000 mg | ORAL_CAPSULE | ORAL | 0 refills | Status: DC

## 2015-11-21 NOTE — Telephone Encounter (Signed)
Requesting:  vyvanse Contract   01/14/2014 UDS   None Last OV    07/13/2015 Last Refill   May 2017  Please Advise

## 2015-11-21 NOTE — Telephone Encounter (Signed)
Called the patient informed (left detailed message) that [facility] are ready for pickup at the front desk.

## 2015-11-21 NOTE — Telephone Encounter (Signed)
OK to refill with an August and a September prescription, no changes to strength, sig

## 2016-01-18 ENCOUNTER — Encounter: Payer: Self-pay | Admitting: Family Medicine

## 2016-01-18 ENCOUNTER — Ambulatory Visit (INDEPENDENT_AMBULATORY_CARE_PROVIDER_SITE_OTHER): Payer: BLUE CROSS/BLUE SHIELD | Admitting: Family Medicine

## 2016-01-18 DIAGNOSIS — F909 Attention-deficit hyperactivity disorder, unspecified type: Secondary | ICD-10-CM | POA: Diagnosis not present

## 2016-01-18 DIAGNOSIS — Z0000 Encounter for general adult medical examination without abnormal findings: Secondary | ICD-10-CM

## 2016-01-18 DIAGNOSIS — E785 Hyperlipidemia, unspecified: Secondary | ICD-10-CM | POA: Insufficient documentation

## 2016-01-18 DIAGNOSIS — Z23 Encounter for immunization: Secondary | ICD-10-CM | POA: Diagnosis not present

## 2016-01-18 DIAGNOSIS — F988 Other specified behavioral and emotional disorders with onset usually occurring in childhood and adolescence: Secondary | ICD-10-CM

## 2016-01-18 HISTORY — DX: Hyperlipidemia, unspecified: E78.5

## 2016-01-18 MED ORDER — LISDEXAMFETAMINE DIMESYLATE 40 MG PO CAPS: 40.0000 mg | ORAL_CAPSULE | ORAL | 0 refills | Status: DC

## 2016-01-18 NOTE — Progress Notes (Signed)
Pre visit review using our clinic review tool, if applicable. No additional management support is needed unless otherwise documented below in the visit note. 

## 2016-01-18 NOTE — Progress Notes (Signed)
[name], female   DOB: [birth year], 25 y.o.   MRN: [id number]   Subjective:    [name], female    DOB: 1990-12-12, 25 y.o.   MRN: [id number]  Chief Complaint  Patient presents with  . Follow-up    HPI Patient is in today for follow up. She is feeling well today. No recent illness or acute concerns. Vyvanse is working well and she is in need of refills today. No c/o HA/insomnia, worsening anxiety. Denies CP/palp/SOB/HA/congestion/fevers/GI or GU c/o. Taking meds as prescribed  Past Medical History:  Diagnosis Date  . ADD (attention deficit disorder) 4 th grade  . Cerumen impaction 01/29/2013  . Cervical cancer screening 04/29/2014  . Chicken pox as a child  . Dermatitis 07/16/2015  . Hyperlipidemia, mild 01/18/2016  . Preventative health care 12/25/2011    Past Surgical History:  Procedure Laterality Date  . TYMPANOSTOMY TUBE PLACEMENT      Family History  Problem Relation Age of Onset  . Hyperlipidemia Mother   . Thyroid disease Mother   . Hypertension Father   . Cancer Father 20    testicular  . Cancer Maternal Grandmother     breast- remission; lung cancer  . Stroke Maternal Grandmother   . Hypertension Paternal Grandfather   . Learning disabilities Sister     Social History   Social History  . Marital status: Single    Spouse name: N/A  . Number of children: N/A  . Years of education: N/A   Occupational History  . Not on file.   Social History Main Topics  . Smoking status: Never Smoker  . Smokeless tobacco: Never Used  . Alcohol use No  . Drug use: No  . Sexual activity: Yes    Partners: Male     Comment: lives at home, no dietary restrictions, Junior year in college,  No new partners   Other Topics Concern  . Not on file   Social History Narrative  . No narrative on file    Outpatient Medications Prior to Visit  Medication Sig Dispense Refill  . desogestrel-ethinyl estradiol (VIORELE) 0.15-0.02/0.01 MG (21/5)  tablet Take 1 tablet by mouth daily. 84 tablet 1  . lisdexamfetamine (VYVANSE) 40 MG capsule Take 1 capsule (40 mg total) by mouth every morning. May 2017 30 capsule 0  . lisdexamfetamine (VYVANSE) 40 MG capsule Take 1 capsule (40 mg total) by mouth every morning. August 2017 30 capsule 0  . lisdexamfetamine (VYVANSE) 40 MG capsule Take 1 capsule (40 mg total) by mouth every morning. September  2017 30 capsule 0   No facility-administered medications prior to visit.     No Known Allergies  Review of Systems  Constitutional: Negative for fever and malaise/fatigue.  HENT: Negative for congestion.   Eyes: Negative for blurred vision.  Respiratory: Negative for shortness of breath.   Cardiovascular: Negative for chest pain, palpitations and leg swelling.  Gastrointestinal: Negative for abdominal pain, blood in stool and nausea.  Genitourinary: Negative for dysuria and frequency.  Musculoskeletal: Negative for falls.  Skin: Negative for rash.  Neurological: Negative for dizziness, loss of consciousness and headaches.  Endo/Heme/Allergies: Negative for environmental allergies.  Psychiatric/Behavioral: Negative for depression. The patient is not nervous/anxious.        Objective:    Physical Exam  Constitutional: She is oriented to person, place, and time. She appears well-developed and well-nourished. No distress.  HENT:  Head: Normocephalic and atraumatic.  Nose: Nose normal.  Eyes: Right  eye exhibits no discharge. Left eye exhibits no discharge.  Neck: Normal range of motion. Neck supple.  Cardiovascular: Normal rate and regular rhythm.   No murmur heard. Pulmonary/Chest: Effort normal and breath sounds normal.  Abdominal: Soft. Bowel sounds are normal. There is no tenderness.  Musculoskeletal: She exhibits no edema.  Neurological: She is alert and oriented to person, place, and time.  Skin: Skin is warm and dry.  Psychiatric: She has a normal mood and affect.  Nursing note and  vitals reviewed.   BP 104/72 (BP Location: Left Arm, Patient Position: Sitting, Cuff Size: Normal)   Pulse 89   Temp 98.3 F (36.8 C) (Oral)   Ht 5\' 4"  (1.626 m)   Wt 128 lb 6 oz (58.2 kg)   SpO2 97%   BMI 22.04 kg/m  Wt Readings from Last 3 Encounters:  01/18/16 128 lb 6 oz (58.2 kg)  07/13/15 136 lb 8 oz (61.9 kg)  01/31/15 138 lb 4 oz (62.7 kg)     Lab Results  Component Value Date   WBC 6.5 02/11/2014   HGB 12.7 02/11/2014   HCT 38.6 02/11/2014   PLT 170.0 02/11/2014   GLUCOSE 90 02/11/2014   CHOL 173 02/11/2014   TRIG 72.0 02/11/2014   HDL 47.20 02/11/2014   LDLCALC 111 (H) 02/11/2014   ALT 13 02/11/2014   AST 17 02/11/2014   NA 137 02/11/2014   K 3.8 02/11/2014   CL 105 02/11/2014   CREATININE 0.8 02/11/2014   BUN 11 02/11/2014   CO2 26 02/11/2014   TSH 0.98 02/11/2014    Lab Results  Component Value Date   TSH 0.98 02/11/2014   Lab Results  Component Value Date   WBC 6.5 02/11/2014   HGB 12.7 02/11/2014   HCT 38.6 02/11/2014   MCV 93.8 02/11/2014   PLT 170.0 02/11/2014   Lab Results  Component Value Date   NA 137 02/11/2014   K 3.8 02/11/2014   CO2 26 02/11/2014   GLUCOSE 90 02/11/2014   BUN 11 02/11/2014   CREATININE 0.8 02/11/2014   BILITOT 0.9 02/11/2014   ALKPHOS 40 02/11/2014   AST 17 02/11/2014   ALT 13 02/11/2014   PROT 7.1 02/11/2014   ALBUMIN 3.7 02/11/2014   ALBUMIN 3.7 02/11/2014   CALCIUM 9.4 02/11/2014   GFR 94.84 02/11/2014   Lab Results  Component Value Date   CHOL 173 02/11/2014   Lab Results  Component Value Date   HDL 47.20 02/11/2014   Lab Results  Component Value Date   LDLCALC 111 (H) 02/11/2014   Lab Results  Component Value Date   TRIG 72.0 02/11/2014   Lab Results  Component Value Date   CHOLHDL 4 02/11/2014   No results found for: HGBA1C     Assessment & Plan:   Problem List Items Addressed This Visit    ADD (attention deficit disorder)    Doing well on current meds, given refills today       Preventative health care    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     I have changed Ms. Darroch's lisdexamfetamine, lisdexamfetamine, and lisdexamfetamine. I am also having her maintain her desogestrel-ethinyl estradiol.  Meds ordered this encounter  Medications  . lisdexamfetamine (VYVANSE) 40 MG  capsule    Sig: Take 1 capsule (40 mg total) by mouth every morning. October  2017    Dispense:  30 capsule    Refill:  0  . lisdexamfetamine (VYVANSE) 40 MG capsule    Sig: Take 1 capsule (40 mg total) by mouth every morning. November  2017    Dispense:  30 capsule    Refill:  0  . lisdexamfetamine (VYVANSE) 40 MG capsule    Sig: Take 1 capsule (40 mg total) by mouth every morning. December  2017    Dispense:  30 capsule    Refill:  0     Danise EdgeBLYTH, STACEY, MD

## 2016-01-18 NOTE — Assessment & Plan Note (Signed)
Encouraged heart healthy diet, increase exercise, avoid trans fats, consider a krill oil cap daily 

## 2016-01-18 NOTE — Assessment & Plan Note (Signed)
Doing well on current meds, given refills today. 

## 2016-01-18 NOTE — Assessment & Plan Note (Signed)
Flu shot given today due to working as [employer]

## 2016-01-18 NOTE — Patient Instructions (Signed)
Attention Deficit Hyperactivity Disorder  Attention deficit hyperactivity disorder (ADHD) is a problem with behavior issues based on the way the brain functions (neurobehavioral disorder). It is a common reason for behavior and academic problems in school.  SYMPTOMS   There are 3 types of ADHD. The 3 types and some of the symptoms include:  · Inattentive.    Gets bored or distracted easily.    Loses or forgets things. Forgets to hand in homework.    Has trouble organizing or completing tasks.    Difficulty staying on task.    An inability to organize daily tasks and school work.    Leaving projects, chores, or homework unfinished.    Trouble paying attention or responding to details. Careless mistakes.    Difficulty following directions. Often seems like is not listening.    Dislikes activities that require sustained attention (like chores or homework).  · Hyperactive-impulsive.    Feels like it is impossible to sit still or stay in a seat. Fidgeting with hands and feet.    Trouble waiting turn.    Talking too much or out of turn. Interruptive.    Speaks or acts impulsively.    Aggressive, disruptive behavior.    Constantly busy or on the go; noisy.    Often leaves seat when they are expected to remain seated.    Often runs or climbs where it is not appropriate, or feels very restless.  · Combined.    Has symptoms of both of the above.  Often children with ADHD feel discouraged about themselves and with school. They often perform well below their abilities in school.  As children get older, the excess motor activities can calm down, but the problems with paying attention and staying organized persist. Most children do not outgrow ADHD but with good treatment can learn to cope with the symptoms.  DIAGNOSIS   When ADHD is suspected, the diagnosis should be made by professionals trained in ADHD. This professional will collect information about the individual suspected of having ADHD. Information must be collected from  various settings where the person lives, works, or attends school.    Diagnosis will include:  · Confirming symptoms began in childhood.  · Ruling out other reasons for the child's behavior.  · The health care providers will check with the child's school and check their medical records.  · They will talk to teachers and parents.  · Behavior rating scales for the child will be filled out by those dealing with the child on a daily basis.  A diagnosis is made only after all information has been considered.  TREATMENT   Treatment usually includes behavioral treatment, tutoring or extra support in school, and stimulant medicines. Because of the way a person's brain works with ADHD, these medicines decrease impulsivity and hyperactivity and increase attention. This is different than how they would work in a person who does not have ADHD. Other medicines used include antidepressants and certain blood pressure medicines.  Most experts agree that treatment for ADHD should address all aspects of the person's functioning. Along with medicines, treatment should include structured classroom management at school. Parents should reward good behavior, provide constant discipline, and set limits. Tutoring should be available for the child as needed.  ADHD is a lifelong condition. If untreated, the disorder can have long-term serious effects into adolescence and adulthood.  HOME CARE INSTRUCTIONS   · Often with ADHD there is a lot of frustration among family members dealing with the condition. Blame   and anger are also feelings that are common. In many cases, because the problem affects the family as a whole, the entire family may need help. A therapist can help the family find better ways to handle the disruptive behaviors of the person with ADHD and promote change. If the person with ADHD is young, most of the therapist's work is with the parents. Parents will learn techniques for coping with and improving their child's behavior.  Sometimes only the child with the ADHD needs counseling. Your health care providers can help you make these decisions.  · Children with ADHD may need help learning how to organize. Some helpful tips include:  ¨ Keep routines the same every day from wake-up time to bedtime. Schedule all activities, including homework and playtime. Keep the schedule in a place where the person with ADHD will often see it. Mark schedule changes as far in advance as possible.  ¨ Schedule outdoor and indoor recreation.  ¨ Have a place for everything and keep everything in its place. This includes clothing, backpacks, and school supplies.  ¨ Encourage writing down assignments and bringing home needed books. Work with your child's teachers for assistance in organizing school work.  · Offer your child a well-balanced diet. Breakfast that includes a balance of whole grains, protein, and fruits or vegetables is especially important for school performance. Children should avoid drinks with caffeine including:  ¨ Soft drinks.  ¨ Coffee.  ¨ Tea.  ¨ However, some older children (adolescents) may find these drinks helpful in improving their attention. Because it can also be common for adolescents with ADHD to become addicted to caffeine, talk with your health care provider about what is a safe amount of caffeine intake for your child.  · Children with ADHD need consistent rules that they can understand and follow. If rules are followed, give small rewards. Children with ADHD often receive, and expect, criticism. Look for good behavior and praise it. Set realistic goals. Give clear instructions. Look for activities that can foster success and self-esteem. Make time for pleasant activities with your child. Give lots of affection.  · Parents are their children's greatest advocates. Learn as much as possible about ADHD. This helps you become a stronger and better advocate for your child. It also helps you educate your child's teachers and instructors  if they feel inadequate in these areas. Parent support groups are often helpful. A national group with local chapters is called Children and Adults with Attention Deficit Hyperactivity Disorder (CHADD).  SEEK MEDICAL CARE IF:  · Your child has repeated muscle twitches, cough, or speech outbursts.  · Your child has sleep problems.  · Your child has a marked loss of appetite.  · Your child develops depression.  · Your child has new or worsening behavioral problems.  · Your child develops dizziness.  · Your child has a racing heart.  · Your child has stomach pains.  · Your child develops headaches.  SEEK IMMEDIATE MEDICAL CARE IF:  · Your child has been diagnosed with depression or anxiety and the symptoms seem to be getting worse.  · Your child has been depressed and suddenly appears to have increased energy or motivation.  · You are worried that your child is having a bad reaction to a medication he or she is taking for ADHD.     This information is not intended to replace advice given to you by your health care provider. Make sure you discuss any questions you have with your   health care provider.     Document Released: 03/29/2002 Document Revised: 04/13/2013 Document Reviewed: 12/14/2012  Elsevier Interactive Patient Education ©2016 [employer]vier Inc.

## 2016-01-21 DIAGNOSIS — M25531 Pain in right wrist: Secondary | ICD-10-CM | POA: Diagnosis not present

## 2016-01-21 DIAGNOSIS — M25532 Pain in left wrist: Secondary | ICD-10-CM | POA: Diagnosis not present

## 2016-02-29 ENCOUNTER — Telehealth: Payer: Self-pay | Admitting: Family Medicine

## 2016-02-29 NOTE — Telephone Encounter (Signed)
She has to know this cannot happen again, this time she can have one month at a time for November and December.
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
## 2016-02-29 NOTE — Telephone Encounter (Signed)
Patient is requesting a refill of lisdexamfetamine (VYVANSE) 40 MG capsule. Please advise   Pharmacy:  [facility] [phone][id number] - [place], [place] - [address]

## 2016-03-01 ENCOUNTER — Other Ambulatory Visit: Payer: Self-pay | Admitting: Family Medicine

## 2016-03-01 MED ORDER — LISDEXAMFETAMINE DIMESYLATE 40 MG PO CAPS: 40.0000 mg | ORAL_CAPSULE | ORAL | 0 refills | Status: DC

## 2016-03-01 NOTE — Telephone Encounter (Signed)
Printed and on counter for signature. Called the patient and did inform her of PCP instructions.  She did agree to do so.

## 2016-04-04 ENCOUNTER — Other Ambulatory Visit: Payer: Self-pay | Admitting: Family Medicine

## 2016-04-19 ENCOUNTER — Other Ambulatory Visit: Payer: Self-pay | Admitting: Family Medicine

## 2016-04-19 NOTE — Telephone Encounter (Signed)
Requesting:   vyvanse Contract    01/14/2014 UDS none Last OV    01/18/2016 Last Refill    November and December 2017  Please Advise

## 2016-04-19 NOTE — Telephone Encounter (Signed)
Patient is calling to request a refill of lisdexamfetamine (VYVANSE) 40 MG capsule  Phone: [phone]

## 2016-04-23 MED ORDER — LISDEXAMFETAMINE DIMESYLATE 40 MG PO CAPS: 40.0000 mg | ORAL_CAPSULE | ORAL | 0 refills | Status: DC

## 2016-04-24 NOTE — Telephone Encounter (Signed)
Patient informed PCP will be back in the office on 04/25/2016/will sign scripts and she can then pickup on Thursday 04/25/2016.  Patient verbally understood and agreed to do so.

## 2016-05-21 ENCOUNTER — Telehealth: Payer: Self-pay | Admitting: Family Medicine

## 2016-05-21 NOTE — Telephone Encounter (Signed)
error 

## 2016-06-06 ENCOUNTER — Telehealth: Payer: Self-pay | Admitting: Family Medicine

## 2016-06-06 NOTE — Telephone Encounter (Signed)
Caller name: Relationship to patient: Self Can be reached: [phone]  Pharmacy:  Reason for call: Patient states she needs a PA to [facility] for her Vyvanse. States her insurance no longer covers it and they need to know from provider that patient has tried all other medications. PA can be called to [phone]

## 2016-06-06 NOTE — Telephone Encounter (Signed)
Initiated PA on cover my meds. 

## 2016-06-07 NOTE — Telephone Encounter (Signed)
[facility]ed [facility] at [phone] to get PA  Approval They did approve this medication from  05/08/2016  Through 06/07/2017 PA  ID#  Is [id number] PATIENT HAS BEEN NOTIFIED OF THIS APPROVAL/PHARMACY WILL BE NOTIFIED AS WELL. ONCE APPROVAL LETTER HAS BEEN SENT TO OUR OFFICE IT WILL BE SCANNED INTO HER CHART.

## 2016-07-04 ENCOUNTER — Ambulatory Visit (INDEPENDENT_AMBULATORY_CARE_PROVIDER_SITE_OTHER): Payer: BLUE CROSS/BLUE SHIELD | Admitting: Family Medicine

## 2016-07-04 VITALS — BP 112/72 | HR 83 | Temp 98.2°F | Ht 64.0 in | Wt 134.2 lb

## 2016-07-04 DIAGNOSIS — N926 Irregular menstruation, unspecified: Secondary | ICD-10-CM

## 2016-07-04 DIAGNOSIS — R350 Frequency of micturition: Secondary | ICD-10-CM | POA: Diagnosis not present

## 2016-07-04 LAB — POC URINALSYSI DIPSTICK (AUTOMATED)
Bilirubin, UA: NEGATIVE
Blood, UA: NEGATIVE
Leukocytes, UA: NEGATIVE
Nitrite, UA: NEGATIVE
Protein, UA: NEGATIVE
SPEC GRAV UA: 1.03 (ref 1.030–1.035)
Urobilinogen, UA: NEGATIVE (ref ?–2.0)
pH, UA: 6 (ref 5.0–8.0)

## 2016-07-04 LAB — POCT URINE PREGNANCY: PREG TEST UR: NEGATIVE

## 2016-07-04 MED ORDER — NITROFURANTOIN MONOHYD MACRO 100 MG PO CAPS: 100.0000 mg | ORAL_CAPSULE | Freq: Two times a day (BID) | ORAL | 0 refills | Status: DC

## 2016-07-04 NOTE — Progress Notes (Signed)
[facility] [address] [place], [place] [address] [phone]  Date:  07/04/2016   Name:  Katie Tucker   DOB:  09-Nov-1990   MRN:  [id number]  PCP:  Danise Edge, MD    Chief Complaint: Contraception (Pt has birth control concerns. c/o heavy and longer cycles. ) and Urinary Tract Infection (c/o possible uti with increased urinary sx's )   History of Present Illness:  Katie Tucker is a 26 y.o. very pleasant female patient who presents with the following:  Pt of Dr. Abner Greenspan here today with concern of possible UTI She has notod urinary frequency and some dysuria today No hematuria No nausea, vomiting, belly pain or back pain  She is on OCP-  She notes that her menses started on 2/22 while she was on the 2nd week of her pack- she continued to bleed for a week and just stopped bleeding on 3/6  No recent change in her pills, she uses a 28 day cycle This has never happened to her in the past  She has been under some stress She takes her pill very regularly and has not missed any doses  She started her new pack this week.     Patient Active Problem List   Diagnosis Date Noted  . Hyperlipidemia, mild 01/18/2016  . Dermatitis 07/16/2015  . Cervical cancer screening 04/29/2014  . Irregular menses 09/30/2013  . Back pain 06/04/2013  . Cerumen impaction 01/29/2013  . Preventative health care 12/25/2011  . ADD (attention deficit disorder)     Past Medical History:  Diagnosis Date  . ADD (attention deficit disorder) 4 th grade  . Cerumen impaction 01/29/2013  . Cervical cancer screening 04/29/2014  . Chicken pox as a child  . Dermatitis 07/16/2015  . Hyperlipidemia, mild 01/18/2016  . Preventative health care 12/25/2011    Past Surgical History:  Procedure Laterality Date  . TYMPANOSTOMY TUBE PLACEMENT      Social History  Substance Use Topics  . Smoking status: Never Smoker  . Smokeless tobacco: Never Used   . Alcohol use No    Family History  Problem Relation Age of Onset  . Hyperlipidemia Mother   . Thyroid disease Mother   . Hypertension Father   . Cancer Father 20    testicular  . Cancer Maternal Grandmother     breast- remission; lung cancer  . Stroke Maternal Grandmother   . Hypertension Paternal Grandfather   . Learning disabilities Sister     No Known Allergies  Medication list has been reviewed and updated.  Current Outpatient Prescriptions on File Prior to Visit  Medication Sig Dispense Refill  . lisdexamfetamine (VYVANSE) 40 MG capsule Take 1 capsule (40 mg total) by mouth every morning. February 2018 30 capsule 0  . lisdexamfetamine (VYVANSE) 40 MG capsule Take 1 capsule (40 mg total) by mouth every morning. January 2018 30 capsule 0  . lisdexamfetamine (VYVANSE) 40 MG capsule Take 1 capsule (40 mg total) by mouth every morning. March 2018 30 capsule 0  . VIORELE 0.15-0.02/0.01 MG (21/5) tablet TAKE 1 TABLET DAILY 84 tablet 1   No current facility-administered medications on file prior to visit.     Review of Systems:  As per HPI- otherwise negative.   Physical Examination: Vitals:   07/04/16 1559  BP: 112/72  Pulse: 83  Temp: 98.2 F (36.8 C)   Vitals:   07/04/16 1559  Weight: 134 lb 3.2 oz (  60.9 kg)  Height: 5\' 4"  (1.626 m)   Body mass index is 23.04 kg/m. Ideal Body Weight: Weight in (lb) to have BMI = 25: 145.3  GEN: WDWN, NAD, Non-toxic, A & O x 3, normal weight, well appearing  HEENT: Atraumatic, Normocephalic. Neck supple. No masses, No LAD. Ears and Nose: No external deformity. CV: RRR, No M/G/R. No JVD. No thrill. No extra heart sounds. PULM: CTA B, no wheezes, crackles, rhonchi. No retractions. No resp. distress. No accessory muscle use. ABD: S, NT, ND, +BS. No rebound. No HSM. EXTR: No c/c/e NEURO Normal gait.  PSYCH: Normally interactive. Conversant. Not depressed or anxious appearing.  Calm demeanor.  Looks well No CVA tenderness   Pelvic: normal, no vaginal lesions or discharge. Uterus normal, no CMT, no adnexal tendereness or masses  Results for orders placed or performed in visit on 07/04/16  POCT Urinalysis Dipstick (Automated)  Result Value Ref Range   Color, UA yellow    Clarity, UA clear    Glucose, UA neagative    Bilirubin, UA negative    Ketones, UA trace    Spec Grav, UA 1.030 1.030 - 1.035   Blood, UA negative    pH, UA 6.0 5.0 - 8.0   Protein, UA negative    Urobilinogen, UA negative Negative - 2.0   Nitrite, UA negative    Leukocytes, UA Negative Negative  POCT urine pregnancy  Result Value Ref Range   Preg Test, Ur Negative Negative    Assessment and Plan: Increased frequency of urination - Plan: POCT Urinalysis Dipstick (Automated), Urine culture, nitrofurantoin, macrocrystal-monohydrate, (MACROBID) 100 MG capsule  Menstrual irregularity - Plan: hCG, quantitative, pregnancy  Here today with possible UTI and menstrual irregularity. She has not changed her OCP but had an usual period last month Her exam is normal and reassuring.  Suspect this was due to hormonal changes or stress; defer addnl work-up unless it happens again   Signed Abbe AmsterdamJessica Chevi Lim, MD

## 2016-07-04 NOTE — Progress Notes (Signed)
Pre visit review using our clinic review tool, if applicable. No additional management support is needed unless otherwise documented below in the visit note. 

## 2016-07-04 NOTE — Patient Instructions (Signed)
We are going to treat you for a possible UTI with macrobid twice a day for one week We will also send your urine for a culture -I will be in touch with this result Please let me know if your urinary symptoms do not resolve, or if you continue to have abnormal menses

## 2016-07-07 LAB — URINE CULTURE

## 2016-07-30 ENCOUNTER — Encounter: Payer: BLUE CROSS/BLUE SHIELD | Admitting: Family Medicine

## 2016-08-07 ENCOUNTER — Encounter: Payer: Self-pay | Admitting: Family Medicine

## 2016-08-08 MED ORDER — LISDEXAMFETAMINE DIMESYLATE 40 MG PO CAPS: 40.0000 mg | ORAL_CAPSULE | ORAL | 0 refills | Status: DC

## 2016-08-29 ENCOUNTER — Encounter: Payer: BLUE CROSS/BLUE SHIELD | Admitting: Family Medicine

## 2016-09-19 ENCOUNTER — Other Ambulatory Visit: Payer: Self-pay | Admitting: Family Medicine

## 2016-11-26 ENCOUNTER — Other Ambulatory Visit (HOSPITAL_COMMUNITY)
Admission: RE | Admit: 2016-11-26 | Discharge: 2016-11-26 | Disposition: A | Discharge status: Home / Self Care | Payer: BLUE CROSS/BLUE SHIELD | Source: Ambulatory Visit | Attending: Family Medicine | Admitting: Family Medicine

## 2016-11-26 ENCOUNTER — Encounter: Payer: Self-pay | Admitting: Family Medicine

## 2016-11-26 ENCOUNTER — Ambulatory Visit (INDEPENDENT_AMBULATORY_CARE_PROVIDER_SITE_OTHER): Payer: BLUE CROSS/BLUE SHIELD | Admitting: Family Medicine

## 2016-11-26 DIAGNOSIS — N760 Acute vaginitis: Secondary | ICD-10-CM | POA: Diagnosis not present

## 2016-11-26 DIAGNOSIS — B9689 Other specified bacterial agents as the cause of diseases classified elsewhere: Secondary | ICD-10-CM | POA: Diagnosis not present

## 2016-11-26 DIAGNOSIS — Z124 Encounter for screening for malignant neoplasm of cervix: Secondary | ICD-10-CM

## 2016-11-26 DIAGNOSIS — F988 Other specified behavioral and emotional disorders with onset usually occurring in childhood and adolescence: Secondary | ICD-10-CM | POA: Diagnosis not present

## 2016-11-26 DIAGNOSIS — Z7251 High risk heterosexual behavior: Secondary | ICD-10-CM | POA: Diagnosis not present

## 2016-11-26 DIAGNOSIS — E785 Hyperlipidemia, unspecified: Secondary | ICD-10-CM

## 2016-11-26 DIAGNOSIS — Z79899 Other long term (current) drug therapy: Secondary | ICD-10-CM | POA: Diagnosis not present

## 2016-11-26 LAB — HIV ANTIBODY (ROUTINE TESTING W REFLEX): HIV: NONREACTIVE

## 2016-11-26 MED ORDER — LISDEXAMFETAMINE DIMESYLATE 40 MG PO CAPS: 40.0000 mg | ORAL_CAPSULE | ORAL | 0 refills | Status: DC

## 2016-11-26 NOTE — Assessment & Plan Note (Signed)
Is not using meds recently. No concerns with meds, will refill

## 2016-11-26 NOTE — Assessment & Plan Note (Signed)
Pap today, no concerns on exam.  

## 2016-11-26 NOTE — Assessment & Plan Note (Signed)
Recently separated from her boyfriend for a couple months now back together will proceed with testing

## 2016-11-26 NOTE — Progress Notes (Signed)
Subjective:  I acted as a Education administrator for Dr. Charlett Blake. [place], [place]  [name], female    DOB: Jul 10, 1990, 26 y.o.   MRN: [id number]  Chief Complaint  Patient presents with  . Annual Exam    HPI  Patient is in today for an annual exam. Patient presently has no acute concerns. She feels well and denies any recent febrile illness or hospitalizations. She did have a couple of months when she and her long term boyfriend were broken up so she was with 2 other people and she wants some testing done to rule out any concerns. Her boyfriend had a pimple type lesion in his private area and has been worried. No pain or burning with lesion. Patient without GYN complaints except for a slight odor recently. No discharge lesions or pain, no breast concerns. Denies CP/palp/SOB/HA/congestion/fevers/GI or GU c/o. Taking meds as prescribed  Patient Care Team: Mosie Lukes, MD as PCP - General (Family Medicine)   Past Medical History:  Diagnosis Date  . ADD (attention deficit disorder) 4 th grade  . Cerumen impaction 01/29/2013  . Cervical cancer screening 04/29/2014  . Chicken pox as a child  . Dermatitis 07/16/2015  . Hyperlipidemia, mild 01/18/2016  . Preventative health care 12/25/2011    Past Surgical History:  Procedure Laterality Date  . TYMPANOSTOMY TUBE PLACEMENT      Family History  Problem Relation Age of Onset  . Hyperlipidemia Mother   . Thyroid disease Mother   . Hypertension Father   . Cancer Father 20       testicular  . Cancer Maternal Grandmother        breast- remission; lung cancer  . Stroke Maternal Grandmother   . Hypertension Paternal Grandfather   . Learning disabilities Sister     Social History   Social History  . Marital status: Single    Spouse name: N/A  . Number of children: N/A  . Years of education: N/A   Occupational History  . Not on file.   Social History Main Topics  . Smoking status: Never Smoker  . Smokeless tobacco: Never Used    . Alcohol use 0.6 oz/week    1 Glasses of wine per week     Comment: Social drinker  . Drug use: No  . Sexual activity: Yes    Partners: Male     Comment: lives at home, no dietary restrictions, Junior year in college,  No new partners   Other Topics Concern  . Not on file   Social History Narrative  . No narrative on file    Outpatient Medications Prior to Visit  Medication Sig Dispense Refill  . VIORELE 0.15-0.02/0.01 MG (21/5) tablet TAKE 1 TABLET DAILY 84 tablet 1  . lisdexamfetamine (VYVANSE) 40 MG capsule Take 1 capsule (40 mg total) by mouth every morning. January 2018 30 capsule 0  . lisdexamfetamine (VYVANSE) 40 MG capsule Take 1 capsule (40 mg total) by mouth every morning. March 2018 30 capsule 0  . lisdexamfetamine (VYVANSE) 40 MG capsule Take 1 capsule (40 mg total) by mouth every morning. April, May and June 2018 90 capsule 0  . nitrofurantoin, macrocrystal-monohydrate, (MACROBID) 100 MG capsule Take 1 capsule (100 mg total) by mouth 2 (two) times daily. 14 capsule 0   No facility-administered medications prior to visit.     No Known Allergies  Review of Systems  Constitutional: Negative for fever and malaise/fatigue.  HENT: Negative for congestion.   Eyes: Negative  for blurred vision.  Respiratory: Negative for cough and shortness of breath.   Cardiovascular: Negative for chest pain, palpitations and leg swelling.  Gastrointestinal: Negative for vomiting.  Musculoskeletal: Negative for back pain.  Skin: Negative for rash.  Neurological: Negative for loss of consciousness and headaches.       Objective:    Physical Exam  Constitutional: She is oriented to person, place, and time. She appears well-developed and well-nourished. No distress.  HENT:  Head: Normocephalic and atraumatic.  Eyes: Conjunctivae are normal.  Neck: Normal range of motion. No thyromegaly present.  Cardiovascular: Normal rate and regular rhythm.   Pulmonary/Chest: Effort normal and  breath sounds normal. She has no wheezes.  Abdominal: Soft. Bowel sounds are normal. There is no tenderness.  Genitourinary: Vagina normal and uterus normal.  Musculoskeletal: Normal range of motion. She exhibits no edema or deformity.  Neurological: She is alert and oriented to person, place, and time.  Skin: Skin is warm and dry. She is not diaphoretic.  Psychiatric: She has a normal mood and affect.    BP 98/66 (BP Location: Left Arm, Patient Position: Sitting, Cuff Size: Normal)   Pulse 63   Temp 98.1 F (36.7 C) (Oral)   Resp 18   Ht _0  (1.626 m)   Wt 139 lb (63 kg)   LMP 11/05/2016 (Approximate)   SpO2 99%   BMI 23.86 kg/m  Wt Readings from Last 3 Encounters:  11/26/16 139 lb (63 kg)  07/04/16 134 lb 3.2 oz (60.9 kg)  01/18/16 128 lb 6 oz (58.2 kg)   BP Readings from Last 3 Encounters:  11/26/16 98/66  07/04/16 112/72  01/18/16 104/72     Immunization History  Administered Date(s) Administered  . DTaP 03/27/1993, 05/23/1993, 08/16/1993, 01/25/1994, 12/04/1998  . HPV Quadrivalent 11/11/2007, 05/18/2008, 10/12/2008  . Hepatitis A 11/11/2007, 05/18/2008  . Hepatitis B 10/16/92, 02/27/1993, 03/03/1993, 08/16/1993  . HiB (PRP-OMP) 03/27/1993, 05/23/1993, 08/16/1993, 07/23/1996  . IPV 03/27/1993, 05/23/1993, 08/16/1993, 12/04/1998  . Influenza Whole 01/21/2011, 01/21/2012  . Influenza,inj,Quad PF,36+ Mos 01/06/2013, 01/31/2015, 01/18/2016  . Influenza-Unspecified 01/31/2014  . MMR 01/25/1994, 12/04/1998, 11/11/2007  . Meningococcal Conjugate 11/11/2007  . PPD Test 06/23/2015  . Td 11/16/2004  . Tdap 12/25/2011    Health Maintenance  Topic Date Due  . CHLAMYDIA SCREENING  02/01/2008  . HIV Screening  02/01/2008  . INFLUENZA VACCINE  11/20/2016  . PAP SMEAR  04/29/2017  . TETANUS/TDAP  12/24/2021    Lab Results  Component Value Date   WBC 6.5 02/11/2014   HGB 12.7 02/11/2014   HCT 38.6 02/11/2014   PLT 170.0 02/11/2014   GLUCOSE 90 02/11/2014   CHOL  173 02/11/2014   TRIG 72.0 02/11/2014   HDL 47.20 02/11/2014   LDLCALC 111 (H) 02/11/2014   ALT 13 02/11/2014   AST 17 02/11/2014   NA 137 02/11/2014   K 3.8 02/11/2014   CL 105 02/11/2014   CREATININE 0.8 02/11/2014   BUN 11 02/11/2014   CO2 26 02/11/2014   TSH 0.98 02/11/2014    Lab Results  Component Value Date   TSH 0.98 02/11/2014   Lab Results  Component Value Date   WBC 6.5 02/11/2014   HGB 12.7 02/11/2014   HCT 38.6 02/11/2014   MCV 93.8 02/11/2014   PLT 170.0 02/11/2014   Lab Results  Component Value Date   NA 137 02/11/2014   K 3.8 02/11/2014   CO2 26 02/11/2014   GLUCOSE 90 02/11/2014   BUN 11  02/11/2014   CREATININE 0.8 02/11/2014   BILITOT 0.9 02/11/2014   ALKPHOS 40 02/11/2014   AST 17 02/11/2014   ALT 13 02/11/2014   PROT 7.1 02/11/2014   ALBUMIN 3.7 02/11/2014   ALBUMIN 3.7 02/11/2014   CALCIUM 9.4 02/11/2014   GFR 94.84 02/11/2014   Lab Results  Component Value Date   CHOL 173 02/11/2014   Lab Results  Component Value Date   HDL 47.20 02/11/2014   Lab Results  Component Value Date   LDLCALC 111 (H) 02/11/2014   Lab Results  Component Value Date   TRIG 72.0 02/11/2014   Lab Results  Component Value Date   CHOLHDL 4 02/11/2014   No results found for: HGBA1C       Assessment & Plan:   Problem List Items Addressed This Visit    ADD (attention deficit disorder)    Is not using meds recently. No concerns with meds, will refill      Cervical cancer screening    Pap today, no concerns on exam.       Relevant Orders   Cytology - PAP   Hyperlipidemia, mild    Encouraged heart healthy diet, increase exercise, avoid trans fats, consider a krill oil cap daily      High risk sexual behavior    Recently separated from her boyfriend for a couple months now back together will proceed with testing      Relevant Orders   Cytology - PAP   HIV antibody (with reflex)   RPR      I have discontinued Ms. Nocito's nitrofurantoin  (macrocrystal-monohydrate). I have also changed her lisdexamfetamine, lisdexamfetamine, and lisdexamfetamine. Additionally, I am having her maintain her VIORELE.  Meds ordered this encounter  Medications  . lisdexamfetamine (VYVANSE) 40 MG capsule    Sig: Take 1 capsule (40 mg total) by mouth every morning. September 2018    Dispense:  30 capsule    Refill:  0  . lisdexamfetamine (VYVANSE) 40 MG capsule    Sig: Take 1 capsule (40 mg total) by mouth every morning. August  2018    Dispense:  30 capsule    Refill:  0  . lisdexamfetamine (VYVANSE) 40 MG capsule    Sig: Take 1 capsule (40 mg total) by mouth every morning. October  2018    Dispense:  30 capsule    Refill:  0    CMA served as Education administrator during this visit. History, Physical and Plan performed by medical provider. Documentation and orders reviewed and attested to.  Penni Homans, MD

## 2016-11-26 NOTE — Patient Instructions (Signed)
Preventive Care 18-39 Years, Female Preventive care refers to lifestyle choices and visits with your health care provider that can promote health and wellness. What does preventive care include?  A yearly physical exam. This is also called an annual well check.  Dental exams once or twice a year.  Routine eye exams. Ask your health care provider how often you should have your eyes checked.  Personal lifestyle choices, including: ? Daily care of your teeth and gums. ? Regular physical activity. ? Eating a healthy diet. ? Avoiding tobacco and drug use. ? Limiting alcohol use. ? Practicing safe sex. ? Taking vitamin and mineral supplements as recommended by your health care provider. What happens during an annual well check? The services and screenings done by your health care provider during your annual well check will depend on your age, overall health, lifestyle risk factors, and family history of disease. Counseling Your health care provider may ask you questions about your:  Alcohol use.  Tobacco use.  Drug use.  Emotional well-being.  Home and relationship well-being.  Sexual activity.  Eating habits.  Work and work Statistician.  Method of birth control.  Menstrual cycle.  Pregnancy history.  Screening You may have the following tests or measurements:  Height, weight, and BMI.  Diabetes screening. This is done by checking your blood sugar (glucose) after you have not eaten for a while (fasting).  Blood pressure.  Lipid and cholesterol levels. These may be checked every 5 years starting at age 66.  Skin check.  Hepatitis C blood test.  Hepatitis B blood test.  Sexually transmitted disease (STD) testing.  BRCA-related cancer screening. This may be done if you have a family history of breast, ovarian, tubal, or peritoneal cancers.  Pelvic exam and Pap test. This may be done every 3 years starting at age 40. Starting at age 59, this may be done every 5  years if you have a Pap test in combination with an HPV test.  Discuss your test results, treatment options, and if necessary, the need for more tests with your health care provider. Vaccines Your health care provider may recommend certain vaccines, such as:  Influenza vaccine. This is recommended every year.  Tetanus, diphtheria, and acellular pertussis (Tdap, Td) vaccine. You may need a Td booster every 10 years.  Varicella vaccine. You may need this if you have not been vaccinated.  HPV vaccine. If you are 69 or younger, you may need three doses over 6 months.  Measles, mumps, and rubella (MMR) vaccine. You may need at least one dose of MMR. You may also need a second dose.  Pneumococcal 13-valent conjugate (PCV13) vaccine. You may need this if you have certain conditions and were not previously vaccinated.  Pneumococcal polysaccharide (PPSV23) vaccine. You may need one or two doses if you smoke cigarettes or if you have certain conditions.  Meningococcal vaccine. One dose is recommended if you are age 27-21 years and a first-year college student living in a residence hall, or if you have one of several medical conditions. You may also need additional booster doses.  Hepatitis A vaccine. You may need this if you have certain conditions or if you travel or work in places where you may be exposed to hepatitis A.  Hepatitis B vaccine. You may need this if you have certain conditions or if you travel or work in places where you may be exposed to hepatitis B.  Haemophilus influenzae type b (Hib) vaccine. You may need this if  you have certain risk factors.  Talk to your health care provider about which screenings and vaccines you need and how often you need them. This information is not intended to replace advice given to you by your health care provider. Make sure you discuss any questions you have with your health care provider. Document Released: 06/04/2001 Document Revised: 12/27/2015  Document Reviewed: 02/07/2015 Elsevier Interactive Patient Education  2017 [employer].

## 2016-11-26 NOTE — Assessment & Plan Note (Signed)
Encouraged heart healthy diet, increase exercise, avoid trans fats, consider a krill oil cap daily 

## 2016-11-27 ENCOUNTER — Telehealth: Payer: Self-pay | Admitting: Family Medicine

## 2016-11-27 LAB — CYTOLOGY - PAP
BACTERIAL VAGINITIS: POSITIVE — AB
CANDIDA VAGINITIS: NEGATIVE
Chlamydia: NEGATIVE
DIAGNOSIS: NEGATIVE
Neisseria Gonorrhea: NEGATIVE
Trichomonas: NEGATIVE

## 2016-11-27 LAB — RPR

## 2016-11-27 NOTE — Telephone Encounter (Signed)
Opened in error

## 2016-12-03 ENCOUNTER — Other Ambulatory Visit: Payer: Self-pay | Admitting: Family Medicine

## 2016-12-03 ENCOUNTER — Encounter: Payer: Self-pay | Admitting: Family Medicine

## 2016-12-03 MED ORDER — METRONIDAZOLE 500 MG PO TABS: 500.0000 mg | ORAL_TABLET | Freq: Three times a day (TID) | ORAL | 0 refills | Status: DC

## 2016-12-27 IMAGING — DX DG KNEE 1-2V*L*
2 series · 2 of 2 positions shown · non-contrast
Comparison: None.

CLINICAL DATA: Bilateral knee pain and tenderness near the patellar
regions, no known injury.

EXAM:
LEFT KNEE - 1-2 VIEW

[knee ap]
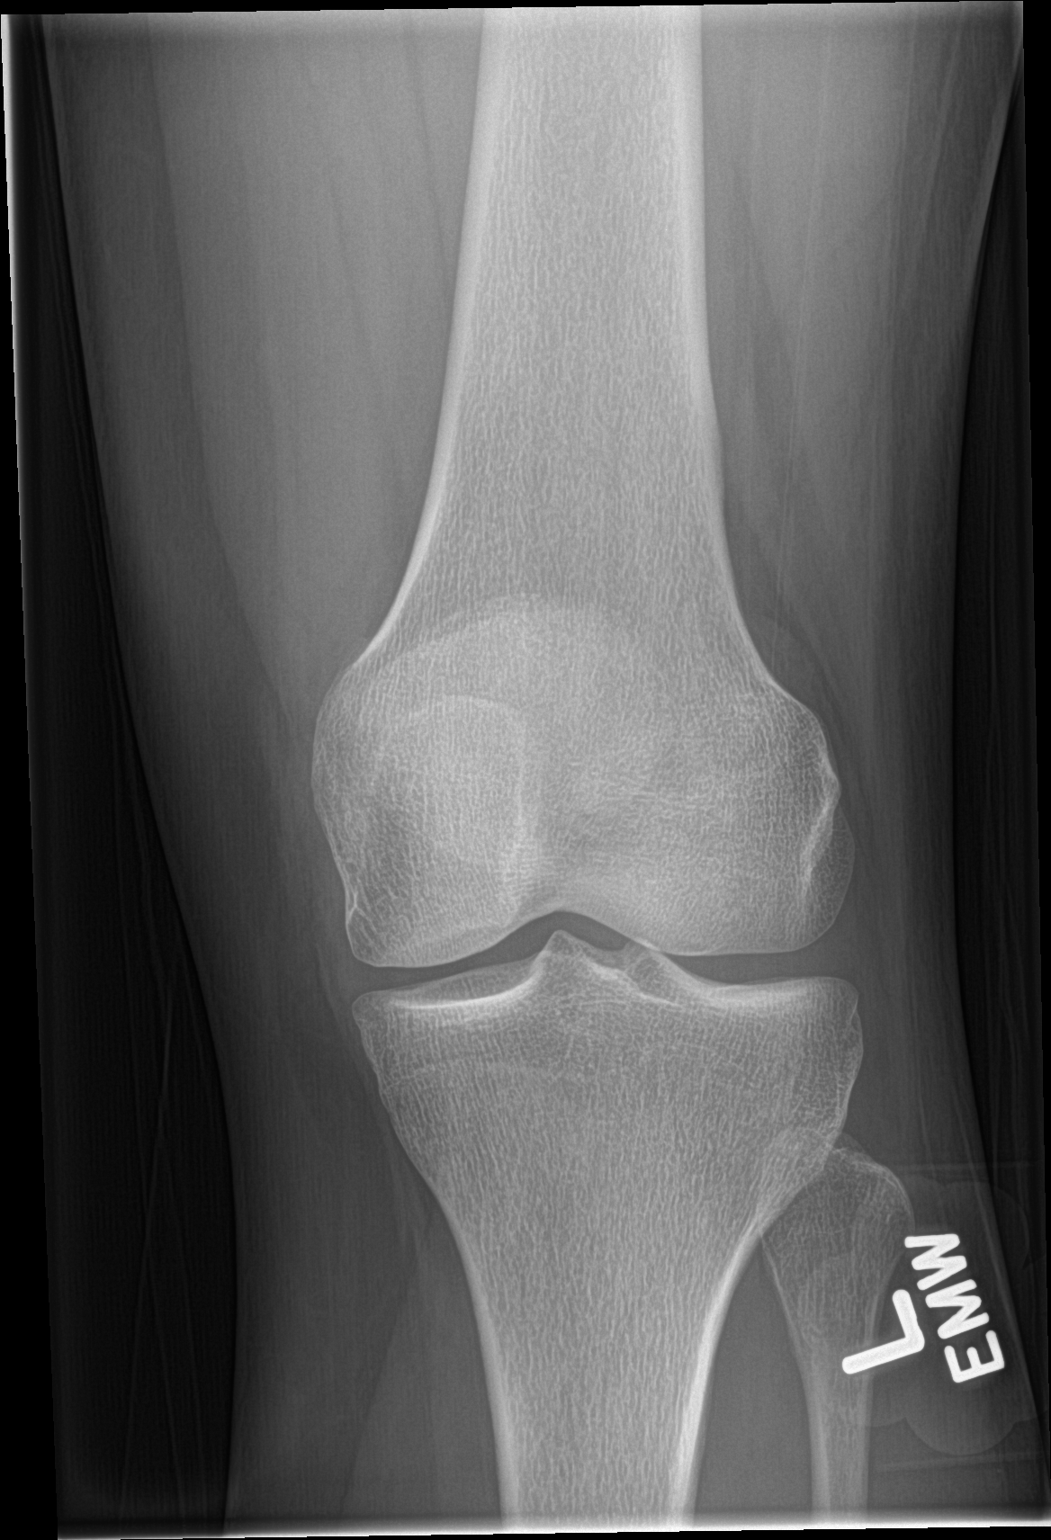

[knee lat]
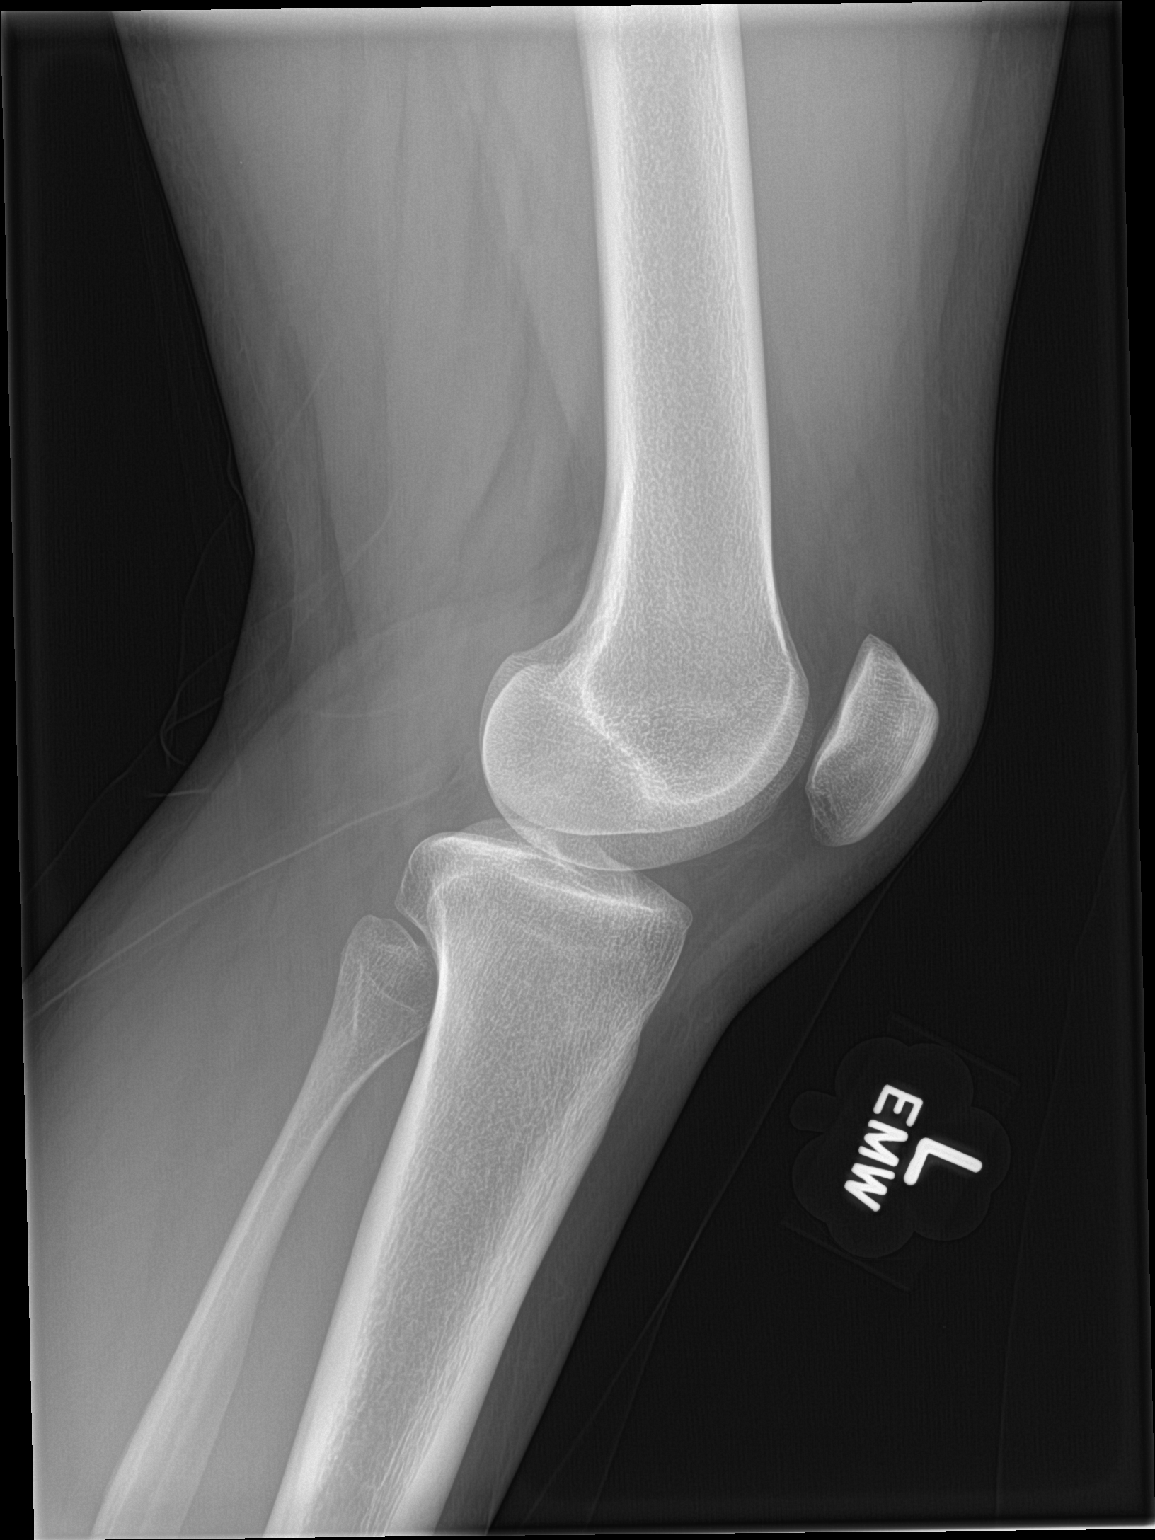

[2 of 2 positions shown; findings below may reference images not displayed]

FINDINGS: The bones of the left knee are adequately mineralized. There is no
joint effusion. There is no spur formation. There is no
chondrocalcinosis. The joint spaces are preserved.
IMPRESSION: There is no acute or chronic bony abnormality of the left knee.
There is no definite joint effusion.

## 2016-12-27 IMAGING — DX DG KNEE 1-2V*R*
2 series · 2 of 2 positions shown · non-contrast
Comparison: Left knee series of today's date

CLINICAL DATA: Bilateral atraumatic knee pain centered near the
patellar regions

EXAM:
RIGHT KNEE - 1-2 VIEW

[knee ap]
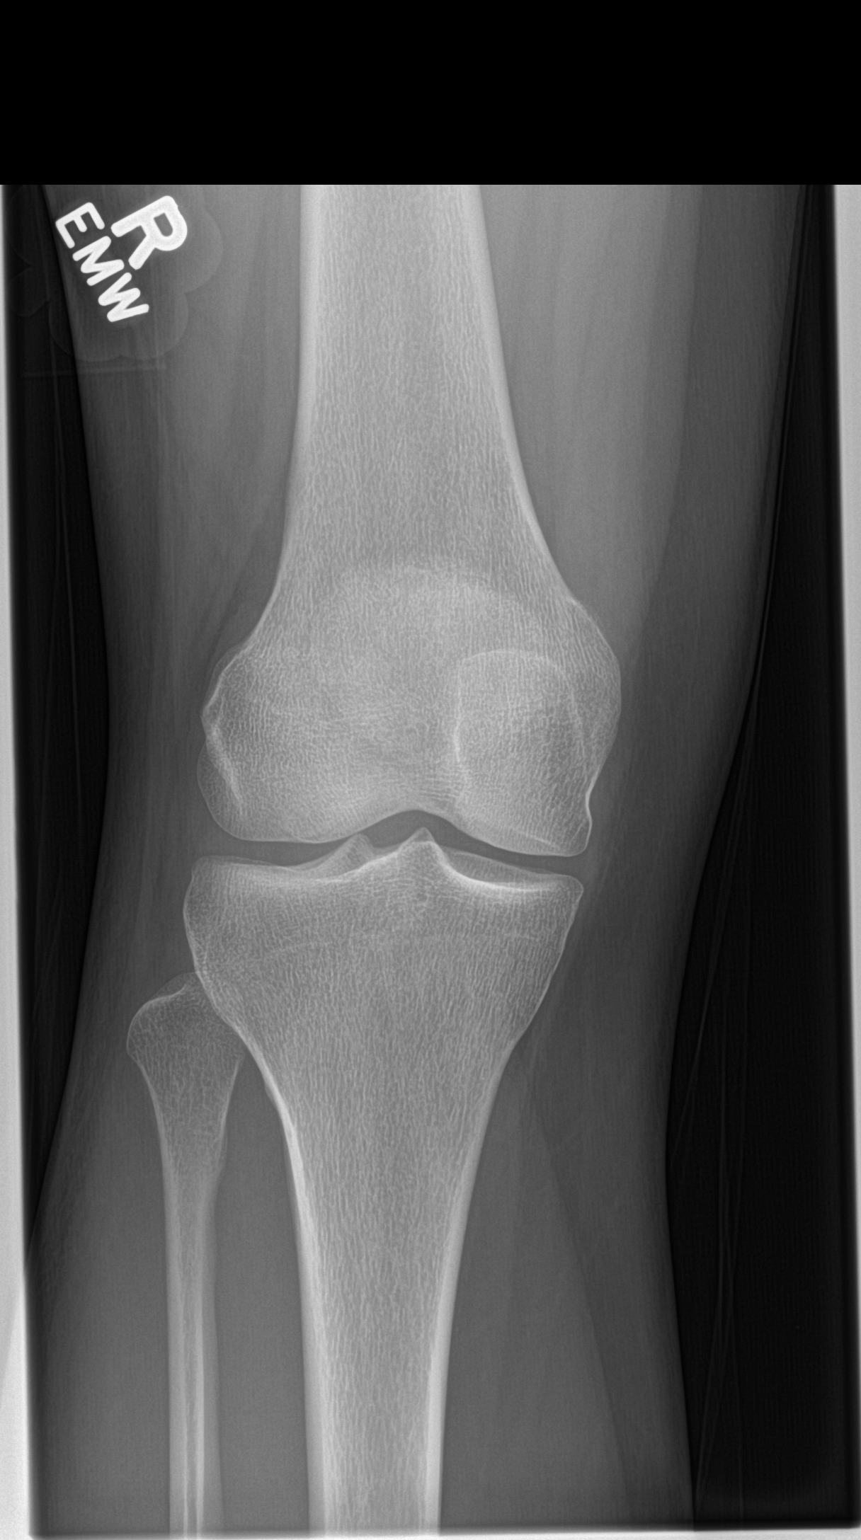

[knee lat]
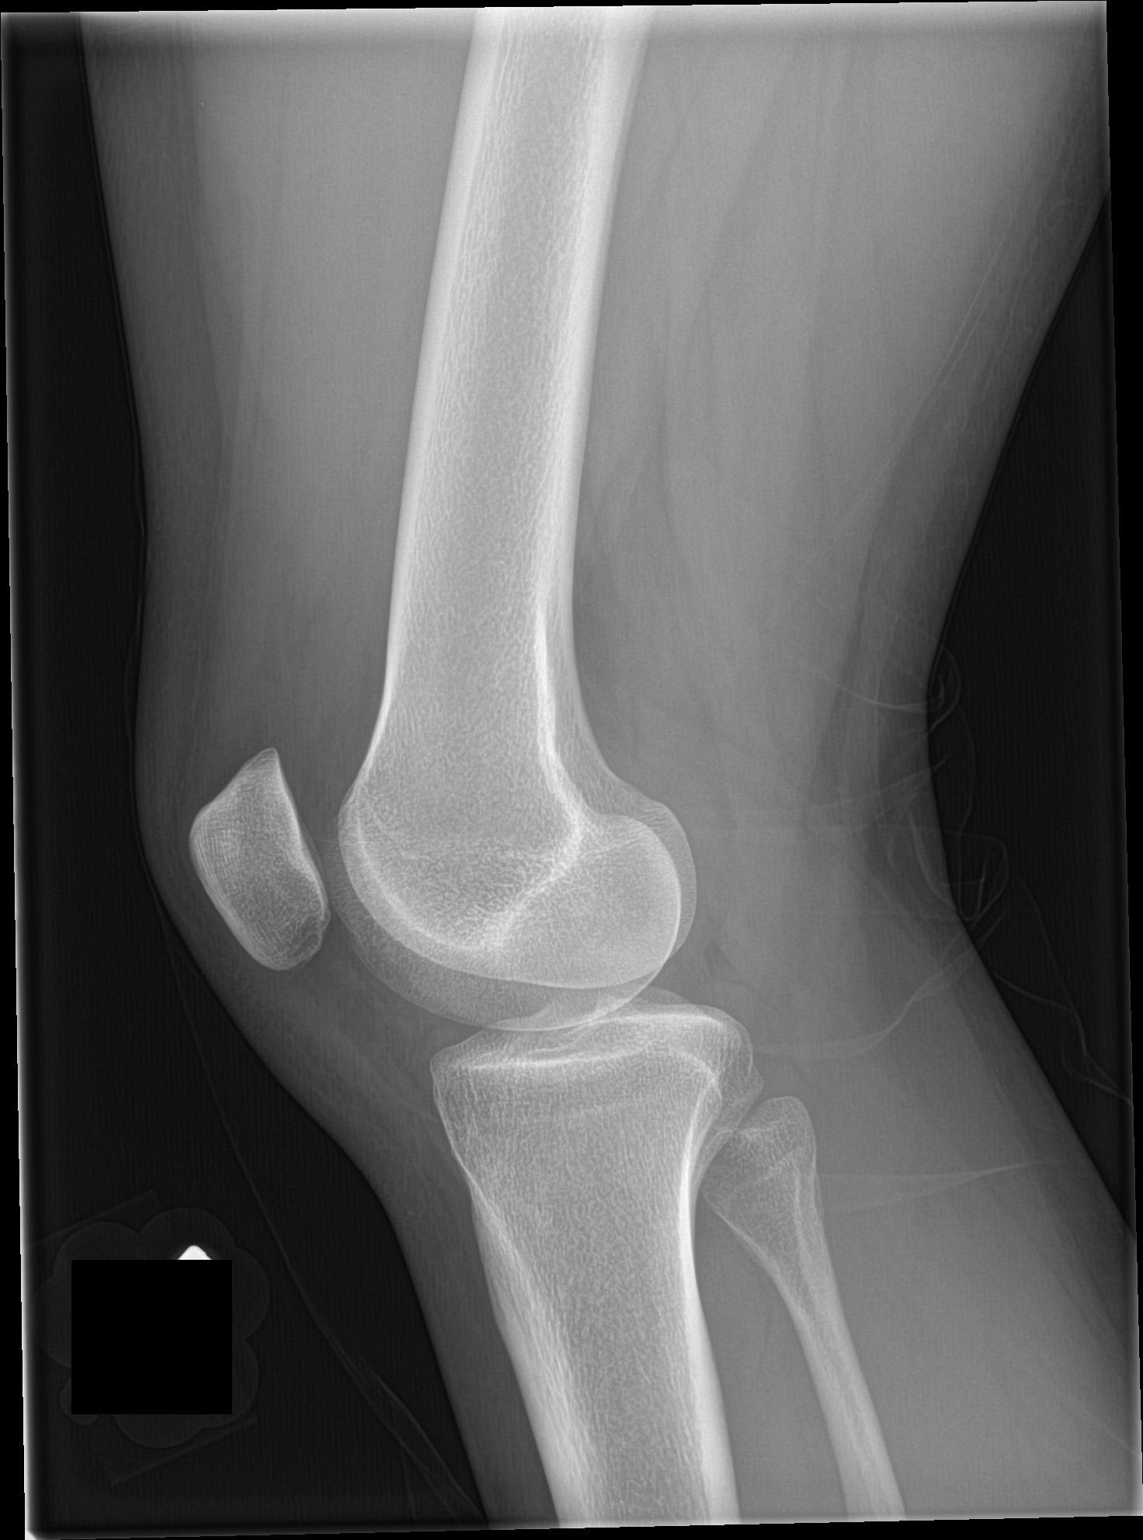

[2 of 2 positions shown; findings below may reference images not displayed]

FINDINGS: The bones of the right knee are adequately mineralized. The joint
spaces are preserved. There is no osteophyte formation. There is no
joint effusion. There are no abnormal soft tissue calcifications.
IMPRESSION: There is no acute or chronic bony abnormality of the right knee.

## 2017-01-01 ENCOUNTER — Encounter: Payer: Self-pay | Admitting: Family Medicine

## 2017-01-03 ENCOUNTER — Other Ambulatory Visit: Payer: Self-pay | Admitting: Family Medicine

## 2017-01-03 DIAGNOSIS — J029 Acute pharyngitis, unspecified: Secondary | ICD-10-CM | POA: Diagnosis not present

## 2017-01-03 DIAGNOSIS — H6690 Otitis media, unspecified, unspecified ear: Secondary | ICD-10-CM | POA: Diagnosis not present

## 2017-01-03 DIAGNOSIS — J209 Acute bronchitis, unspecified: Secondary | ICD-10-CM | POA: Diagnosis not present

## 2017-01-03 MED ORDER — DESOGESTREL-ETHINYL ESTRADIOL 0.15-0.02/0.01 MG (21/5) PO TABS: 1.0000 | ORAL_TABLET | Freq: Every day | ORAL | 1 refills | Status: DC

## 2017-01-10 DIAGNOSIS — H6123 Impacted cerumen, bilateral: Secondary | ICD-10-CM | POA: Diagnosis not present

## 2017-01-10 DIAGNOSIS — R05 Cough: Secondary | ICD-10-CM | POA: Diagnosis not present

## 2017-01-10 DIAGNOSIS — J209 Acute bronchitis, unspecified: Secondary | ICD-10-CM | POA: Diagnosis not present

## 2017-01-10 DIAGNOSIS — H6690 Otitis media, unspecified, unspecified ear: Secondary | ICD-10-CM | POA: Diagnosis not present

## 2017-02-17 DIAGNOSIS — M25531 Pain in right wrist: Secondary | ICD-10-CM | POA: Diagnosis not present

## 2017-02-17 DIAGNOSIS — H6123 Impacted cerumen, bilateral: Secondary | ICD-10-CM | POA: Diagnosis not present

## 2017-03-06 ENCOUNTER — Other Ambulatory Visit: Payer: Self-pay | Admitting: Family Medicine

## 2017-04-11 ENCOUNTER — Telehealth: Payer: Self-pay | Admitting: Family Medicine

## 2017-04-11 NOTE — Telephone Encounter (Signed)
Rx for VYVANSE dated 08/08/2016 was not picked up. Document was shredded.

## 2017-05-08 ENCOUNTER — Ambulatory Visit: Payer: BLUE CROSS/BLUE SHIELD | Admitting: Family Medicine

## 2017-05-08 ENCOUNTER — Encounter: Payer: Self-pay | Admitting: Family Medicine

## 2017-05-08 VITALS — BP 100/70 | HR 72 | Temp 98.0°F | Resp 18 | Wt 149.2 lb

## 2017-05-08 DIAGNOSIS — R635 Abnormal weight gain: Secondary | ICD-10-CM | POA: Diagnosis not present

## 2017-05-08 DIAGNOSIS — R51 Headache: Secondary | ICD-10-CM | POA: Diagnosis not present

## 2017-05-08 DIAGNOSIS — F988 Other specified behavioral and emotional disorders with onset usually occurring in childhood and adolescence: Secondary | ICD-10-CM | POA: Diagnosis not present

## 2017-05-08 DIAGNOSIS — N926 Irregular menstruation, unspecified: Secondary | ICD-10-CM | POA: Diagnosis not present

## 2017-05-08 DIAGNOSIS — R519 Headache, unspecified: Secondary | ICD-10-CM

## 2017-05-08 MED ORDER — BUTALBITAL-ACETAMINOPHEN 50-300 MG PO TABS: 1.0000 | ORAL_TABLET | Freq: Three times a day (TID) | ORAL | 1 refills | Status: DC | PRN

## 2017-05-08 MED ORDER — SUMATRIPTAN SUCCINATE 50 MG PO TABS: 25.0000 mg | ORAL_TABLET | Freq: Once | ORAL | 1 refills | Status: DC

## 2017-05-08 NOTE — Progress Notes (Signed)
Subjective:  I acted as a Education administrator for Dr. Charlett Blake. [place], [place]  [name], female    DOB: 1990-07-28, 27 y.o.   MRN: [id number]  No chief complaint on file.   HPI  Patient is in today for an acute visit for headaches over the past 3 months.  She notes the episodes are occurring about 3 times a week and once the headache starts it often will not resolve until she lies down at night for rest.  No over-the-counter medications have been particularly helpful.  She is tried several Excedrin products and they help somewhat but only temporarily.  She denies any recent changes in diet, recent falls or head trauma.  No significant fever or congestion.  She does acknowledge the headaches often has photophobia, phonophobia and nausea associated.  She diet denies scotomata or vomiting.  No other neurologic complaints.  She does note her all contraceptive was switched around the time the headaches worsened and she is now switched back to the original and is hoping that will help.  No recent febrile illness or hospitalizations. Denies CP/palp/SOB/congestion/fevers/GI or GU c/o. Taking meds as prescribed  Patient Care Team: Mosie Lukes, MD as PCP - General (Family Medicine)   Past Medical History:  Diagnosis Date  . ADD (attention deficit disorder) 4 th grade  . Cerumen impaction 01/29/2013  . Cervical cancer screening 04/29/2014  . Chicken pox as a child  . Dermatitis 07/16/2015  . Hyperlipidemia, mild 01/18/2016  . Preventative health care 12/25/2011    Past Surgical History:  Procedure Laterality Date  . TYMPANOSTOMY TUBE PLACEMENT      Family History  Problem Relation Age of Onset  . Hyperlipidemia Mother   . Thyroid disease Mother   . Hypertension Father   . Cancer Father 20       testicular  . Cancer Maternal Grandmother        breast- remission; lung cancer  . Stroke Maternal Grandmother   . Hypertension Paternal Grandfather   . Learning disabilities Sister      Social History   Socioeconomic History  . Marital status: Single    Spouse name: Not on file  . Number of children: Not on file  . Years of education: Not on file  . Highest education level: Not on file  Social Needs  . Financial resource strain: Not on file  . Food insecurity - worry: Not on file  . Food insecurity - inability: Not on file  . Transportation needs - medical: Not on file  . Transportation needs - non-medical: Not on file  Occupational History  . Not on file  Tobacco Use  . Smoking status: Never Smoker  . Smokeless tobacco: Never Used  Substance and Sexual Activity  . Alcohol use: Yes    Alcohol/week: 0.6 oz    Types: 1 Glasses of wine per week    Comment: Social drinker  . Drug use: No  . Sexual activity: Yes    Partners: Male    Comment: lives at home, no dietary restrictions, Junior year in college,  No new partners  Other Topics Concern  . Not on file  Social History Narrative  . Not on file    Outpatient Medications Prior to Visit  Medication Sig Dispense Refill  . lisdexamfetamine (VYVANSE) 40 MG capsule Take 1 capsule (40 mg total) by mouth every morning. September 2018 30 capsule 0  . lisdexamfetamine (VYVANSE) 40 MG capsule Take 1 capsule (40 mg total) by  mouth every morning. August  2018 30 capsule 0  . lisdexamfetamine (VYVANSE) 40 MG capsule Take 1 capsule (40 mg total) by mouth every morning. October  2018 30 capsule 0  . metroNIDAZOLE (FLAGYL) 500 MG tablet Take 1 tablet (500 mg total) by mouth 3 (three) times daily. 21 tablet 0  . VIORELE 0.15-0.02/0.01 MG (21/5) tablet TAKE 1 TABLET DAILY 84 tablet 1   No facility-administered medications prior to visit.     No Known Allergies  Review of Systems  Constitutional: Negative for fever and malaise/fatigue.  HENT: Negative for congestion.   Eyes: Positive for photophobia. Negative for blurred vision.  Respiratory: Negative for shortness of breath.   Cardiovascular: Negative for chest  pain, palpitations and leg swelling.  Gastrointestinal: Positive for nausea. Negative for abdominal pain, blood in stool and vomiting.  Genitourinary: Negative for dysuria and frequency.  Musculoskeletal: Negative for falls.  Skin: Negative for rash.  Neurological: Positive for headaches. Negative for dizziness, tingling, sensory change, speech change, focal weakness and loss of consciousness.  Endo/Heme/Allergies: Negative for environmental allergies.  Psychiatric/Behavioral: Negative for depression. The patient is not nervous/anxious.        Objective:    Physical Exam  Constitutional: She is oriented to person, place, and time. She appears well-developed and well-nourished. No distress.  HENT:  Head: Normocephalic and atraumatic.  Nose: Nose normal.  Eyes: Right eye exhibits no discharge. Left eye exhibits no discharge.  Neck: Normal range of motion. Neck supple.  Cardiovascular: Normal rate and regular rhythm.  No murmur heard. Pulmonary/Chest: Effort normal and breath sounds normal.  Abdominal: Soft. Bowel sounds are normal. There is no tenderness.  Musculoskeletal: Normal range of motion. She exhibits no edema.  Neurological: She is alert and oriented to person, place, and time. She displays normal reflexes. No cranial nerve deficit. Coordination normal.  Skin: Skin is warm and dry.  Psychiatric: She has a normal mood and affect.  Nursing note and vitals reviewed.   BP 100/70 (BP Location: Left Arm, Patient Position: Sitting, Cuff Size: Normal)   Pulse 72   Temp 98 F (36.7 C) (Oral)   Resp 18   Wt 149 lb 3.2 oz (67.7 kg)   LMP 04/29/2017   SpO2 99%   BMI 25.61 kg/m  Wt Readings from Last 3 Encounters:  05/08/17 149 lb 3.2 oz (67.7 kg)  11/26/16 139 lb (63 kg)  07/04/16 134 lb 3.2 oz (60.9 kg)   BP Readings from Last 3 Encounters:  05/08/17 100/70  11/26/16 98/66  07/04/16 112/72     Immunization History  Administered Date(s) Administered  . DTaP  03/27/1993, 05/23/1993, 08/16/1993, 01/25/1994, 12/04/1998  . HPV Quadrivalent 11/11/2007, 05/18/2008, 10/12/2008  . Hepatitis A 11/11/2007, 05/18/2008  . Hepatitis B 03-Nov-1992, 02/27/1993, 03/03/1993, 08/16/1993  . HiB (PRP-OMP) 03/27/1993, 05/23/1993, 08/16/1993, 07/23/1996  . IPV 03/27/1993, 05/23/1993, 08/16/1993, 12/04/1998  . Influenza Whole 01/21/2011, 01/21/2012  . Influenza,inj,Quad PF,6+ Mos 01/06/2013, 01/31/2015, 01/18/2016  . Influenza-Unspecified 01/31/2014  . MMR 01/25/1994, 12/04/1998, 11/11/2007  . Meningococcal Conjugate 11/11/2007  . PPD Test 06/23/2015  . Td 11/16/2004  . Tdap 12/25/2011    Health Maintenance  Topic Date Due  . INFLUENZA VACCINE  11/20/2016  . PAP SMEAR  11/27/2019  . TETANUS/TDAP  12/24/2021  . HIV Screening  Completed    Lab Results  Component Value Date   WBC 5.7 05/08/2017   HGB 13.4 05/08/2017   HCT 39.7 05/08/2017   PLT 187.0 05/08/2017   GLUCOSE 95 05/08/2017  CHOL 173 02/11/2014   TRIG 72.0 02/11/2014   HDL 47.20 02/11/2014   LDLCALC 111 (H) 02/11/2014   ALT 14 05/08/2017   AST 19 05/08/2017   NA 137 05/08/2017   K 5.2 (H) 05/08/2017   CL 104 05/08/2017   CREATININE 0.70 05/08/2017   BUN 17 05/08/2017   CO2 29 05/08/2017   TSH 0.85 05/08/2017    Lab Results  Component Value Date   TSH 0.85 05/08/2017   Lab Results  Component Value Date   WBC 5.7 05/08/2017   HGB 13.4 05/08/2017   HCT 39.7 05/08/2017   MCV 93.5 05/08/2017   PLT 187.0 05/08/2017   Lab Results  Component Value Date   NA 137 05/08/2017   K 5.2 (H) 05/08/2017   CO2 29 05/08/2017   GLUCOSE 95 05/08/2017   BUN 17 05/08/2017   CREATININE 0.70 05/08/2017   BILITOT 0.4 05/08/2017   ALKPHOS 48 05/08/2017   AST 19 05/08/2017   ALT 14 05/08/2017   PROT 6.4 05/08/2017   ALBUMIN 4.1 05/08/2017   CALCIUM 9.4 05/08/2017   GFR 109.02 05/08/2017   Lab Results  Component Value Date   CHOL 173 02/11/2014   Lab Results  Component Value Date   HDL  47.20 02/11/2014   Lab Results  Component Value Date   LDLCALC 111 (H) 02/11/2014   Lab Results  Component Value Date   TRIG 72.0 02/11/2014   Lab Results  Component Value Date   CHOLHDL 4 02/11/2014   No results found for: HGBA1C       Assessment & Plan:   Problem List Items Addressed This Visit    ADD (attention deficit disorder)    Has been tolerating vyvanse and headaches have not correlated with use. May need to change meds if symptoms persistent.       Irregular menses    Improved with OCP but when she was placed on the 3 month continuous prescription the timing correlates with increased headaches. Has switched back to Viorelle. She will notify us if symptoms worsen or do not resolve.       Nonintractable headache - Primary    Worse than usual over past 3 months, happens roughly 3 x a week and they do not resolve til she sleeps most times.. Episodes include photophobia, phonophobia and nausea. Encouraged increased hydration, 64 ounces of clear fluids daily. Minimize alcohol and caffeine. Eat small frequent meals with lean proteins and complex carbs. Avoid high and low blood sugars. Get adequate sleep, 7-8 hours a night. Needs exercise daily preferably in the morning. May try Bupap prn if no response then can try Imitrex 50 to 100 mg prn. Report worsening symptoms      Relevant Medications   Butalbital-Acetaminophen 50-300 MG TABS   Other Relevant Orders   TSH (Completed)   CBC (Completed)   Comprehensive metabolic panel (Completed)   Weight gain    Encouraged heart healthy diet, decrease po intake and increase exercise as tolerated. Needs 7-8 hours of sleep nightly. Avoid trans fats, eat small, frequent meals every 4-5 hours with lean proteins, complex carbs and healthy fats. Minimize simple carbs, GMO foods.      Relevant Orders   TSH (Completed)   CBC (Completed)   Comprehensive metabolic panel (Completed)      I am having Kerston Bosques start on  Butalbital-Acetaminophen and SUMAtriptan. I am also having her maintain her lisdexamfetamine, lisdexamfetamine, lisdexamfetamine, metroNIDAZOLE, and VIORELE.  Meds ordered this encounter  Medications  .  Butalbital-Acetaminophen 50-300 MG TABS    Sig: Take 1 tablet by mouth 3 (three) times daily as needed.    Dispense:  30 tablet    Refill:  1  . SUMAtriptan (IMITREX) 50 MG tablet    Sig: Take 0.5-2 tablets (25-100 mg total) by mouth once for 1 dose. May repeat in 2 hours if headache persists or recurs.    Dispense:  15 tablet    Refill:  1    CMA served as scribe during this visit. History, Physical and Plan performed by medical provider. Documentation and orders reviewed and attested to.  Penni Homans, MD

## 2017-05-08 NOTE — Patient Instructions (Signed)
Encouraged increased hydration, 64 ounces of clear fluids daily. Minimize alcohol and caffeine. Eat small frequent meals with lean proteins and complex carbs. Avoid high and low blood sugars. Get adequate sleep, 7-8 hours a night. Needs exercise daily preferably in the morning. Migraine Headache A migraine headache is a very strong throbbing pain on one side or both Bringle of your head. Migraines can also cause other symptoms. Talk with your doctor about what things may bring on (trigger) your migraine headaches. Follow these instructions at home: Medicines  Take over-the-counter and prescription medicines only as told by your doctor.  Do not drive or use heavy machinery while taking prescription pain medicine.  To prevent or treat constipation while you are taking prescription pain medicine, your doctor may recommend that you: ? Drink enough fluid to keep your pee (urine) clear or pale yellow. ? Take over-the-counter or prescription medicines. ? Eat foods that are high in fiber. These include fresh fruits and vegetables, whole grains, and beans. ? Limit foods that are high in fat and processed sugars. These include fried and sweet foods. Lifestyle  Avoid alcohol.  Do not use any products that contain nicotine or tobacco, such as cigarettes and e-cigarettes. If you need help quitting, ask your doctor.  Get at least 8 hours of sleep every night.  Limit your stress. General instructions   Keep a journal to find out what may bring on your migraines. For example, write down: ? What you eat and drink. ? How much sleep you get. ? Any change in what you eat or drink. ? Any change in your medicines.  If you have a migraine: ? Avoid things that make your symptoms worse, such as bright lights. ? It may help to lie down in a dark, quiet room. ? Do not drive or use heavy machinery. ? Ask your doctor what activities are safe for you.  Keep all follow-up visits as told by your doctor. This is  important. Contact a doctor if:  You get a migraine that is different or worse than your usual migraines. Get help right away if:  Your migraine gets very bad.  You have a fever.  You have a stiff neck.  You have trouble seeing.  Your muscles feel weak or like you cannot control them.  You start to lose your balance a lot.  You start to have trouble walking.  You pass out (faint). This information is not intended to replace advice given to you by your health care provider. Make sure you discuss any questions you have with your health care provider. Document Released: 01/16/2008 Document Revised: 10/27/2015 Document Reviewed: 09/25/2015 Elsevier Interactive Patient Education  2018 [employer]

## 2017-05-09 LAB — CBC
HCT: 39.7 % (ref 36.0–46.0)
Hemoglobin: 13.4 g/dL (ref 12.0–15.0)
MCHC: 33.7 g/dL (ref 30.0–36.0)
MCV: 93.5 fl (ref 78.0–100.0)
Platelets: 187 10*3/uL (ref 150.0–400.0)
RBC: 4.24 Mil/uL (ref 3.87–5.11)
RDW: 13.1 % (ref 11.5–15.5)
WBC: 5.7 10*3/uL (ref 4.0–10.5)

## 2017-05-09 LAB — COMPREHENSIVE METABOLIC PANEL
ALT: 14 U/L (ref 0–35)
AST: 19 U/L (ref 0–37)
Albumin: 4.1 g/dL (ref 3.5–5.2)
Alkaline Phosphatase: 48 U/L (ref 39–117)
BILIRUBIN TOTAL: 0.4 mg/dL (ref 0.2–1.2)
BUN: 17 mg/dL (ref 6–23)
CO2: 29 mEq/L (ref 19–32)
Calcium: 9.4 mg/dL (ref 8.4–10.5)
Chloride: 104 mEq/L (ref 96–112)
Creatinine, Ser: 0.7 mg/dL (ref 0.40–1.20)
GFR: 109.02 mL/min (ref >60.00)
GLUCOSE: 95 mg/dL (ref 70–99)
Potassium: 5.2 mEq/L — ABNORMAL HIGH (ref 3.5–5.1)
SODIUM: 137 meq/L (ref 135–145)
Total Protein: 6.4 g/dL (ref 6.0–8.3)

## 2017-05-09 LAB — TSH: TSH: 0.85 u[IU]/mL (ref 0.35–4.50)

## 2017-05-12 ENCOUNTER — Encounter: Payer: Self-pay | Admitting: Family Medicine

## 2017-05-12 DIAGNOSIS — R51 Headache: Secondary | ICD-10-CM

## 2017-05-12 DIAGNOSIS — R519 Headache, unspecified: Secondary | ICD-10-CM | POA: Insufficient documentation

## 2017-05-12 DIAGNOSIS — R635 Abnormal weight gain: Secondary | ICD-10-CM | POA: Insufficient documentation

## 2017-05-12 NOTE — Assessment & Plan Note (Signed)
Improved with OCP but when she was placed on the 3 month continuous prescription the timing correlates with increased headaches. Has switched back to Viorelle. She will notify us if symptoms worsen or do not resolve.

## 2017-05-12 NOTE — Assessment & Plan Note (Signed)
Worse than usual over past 3 months, happens roughly 3 x a week and they do not resolve til she sleeps most times.. Episodes include photophobia, phonophobia and nausea. Encouraged increased hydration, 64 ounces of clear fluids daily. Minimize alcohol and caffeine. Eat small frequent meals with lean proteins and complex carbs. Avoid high and low blood sugars. Get adequate sleep, 7-8 hours a night. Needs exercise daily preferably in the morning. May try Bupap prn if no response then can try Imitrex 50 to 100 mg prn. Report worsening symptoms

## 2017-05-12 NOTE — Assessment & Plan Note (Signed)
Has been tolerating vyvanse and headaches have not correlated with use. May need to change meds if symptoms persistent.

## 2017-05-12 NOTE — Assessment & Plan Note (Signed)
Encouraged heart healthy diet, decrease po intake and increase exercise as tolerated. Needs 7-8 hours of sleep nightly. Avoid trans fats, eat small, frequent meals every 4-5 hours with lean proteins, complex carbs and healthy fats. Minimize simple carbs, GMO foods. 

## 2017-05-14 ENCOUNTER — Telehealth: Payer: Self-pay | Admitting: Family Medicine

## 2017-05-14 NOTE — Telephone Encounter (Signed)
Copied from CRM [id number]. Topic: Inquiry >> May 14, 2017  4:06 PM Stephannie LiSimmons, Dorina Ribaudo L, NT wrote: Reason for CRM: Patient was told to make an appointment fos labs, there are no orders  please advise [phone]

## 2017-05-15 ENCOUNTER — Other Ambulatory Visit: Payer: Self-pay | Admitting: Family

## 2017-05-15 DIAGNOSIS — E875 Hyperkalemia: Secondary | ICD-10-CM

## 2017-05-15 NOTE — Telephone Encounter (Signed)
Spoke with patient and added her on lab schedule

## 2017-05-16 ENCOUNTER — Other Ambulatory Visit: Payer: BLUE CROSS/BLUE SHIELD

## 2017-05-29 ENCOUNTER — Ambulatory Visit: Payer: BLUE CROSS/BLUE SHIELD | Admitting: Family Medicine

## 2017-06-06 ENCOUNTER — Telehealth: Payer: Self-pay | Admitting: Family Medicine

## 2017-06-06 NOTE — Telephone Encounter (Signed)
Copied from CRM [id number]. Topic: Quick Communication - See Telephone Encounter >> Jun 06, 2017 12:29 PM Diana EvesHoyt, Maryann B wrote: CRM for notification. See Telephone encounter for:  Pt just got her birth control in the mail and they sent her the azurette and it causes her to gain weight she would like to be on the other one (viorele) she was taking and stay on that one.  06/06/17.

## 2017-06-07 ENCOUNTER — Encounter: Payer: Self-pay | Admitting: Family Medicine

## 2017-06-08 NOTE — Telephone Encounter (Signed)
They are the same thing chemically just made by different companies, [facility]. We can try sending the Viorelle again with the DAW button pushed and see if the pharmacy dispenses the correct thing.

## 2017-06-09 ENCOUNTER — Other Ambulatory Visit: Payer: Self-pay

## 2017-06-09 MED ORDER — VIORELE 0.15-0.02/0.01 MG (21/5) PO TABS: 1.0000 | ORAL_TABLET | Freq: Every day | ORAL | 1 refills | Status: DC

## 2017-06-09 NOTE — Telephone Encounter (Signed)
Thanks, make sure patient knows

## 2017-06-09 NOTE — Telephone Encounter (Signed)
Rx has been fixed and resent I spoke with the pharmacist at [facility]

## 2017-06-10 NOTE — Telephone Encounter (Signed)
Spoke with pt to inform her that her Rx has been fixed.

## 2017-07-29 NOTE — Progress Notes (Addendum)
[facility] [address], [address] [place], [place] [address] [phone]  Date:  07/31/2017   Name:  Katie Tucker   DOB:  [birth year]   MRN:  [id number]  PCP:  Bradd Canary, MD    Chief Complaint: Hip Pain (c/o right hip pain x 1 month. Noticed more when going from sitting to standing. Would like thyroid testing pt has fam hx of thyroid problems. )   History of Present Illness:  Katie Tucker is a 27 y.o. very pleasant female patient who presents with the following:  Here today with concern of hip pain  Pt of Dr. Abner Greenspan but I did see her in the office about one year ago   She has noted right hip pain for about a month, sometimes the left will hurt a bit but the right is the main concern She is not aware of any injury She has more pain if she has been sitting and then stands up.   She is on her feet, up and down at her job quite a bit She is generally in good health otherwise   Her mother and GM, and her brother have thyroid issues  She has gained some weight over the last year and would like to make sure her thyroid is ok  Wt Readings from Last 3 Encounters:  07/31/17 162 lb 6.4 oz (73.7 kg)  05/08/17 149 lb 3.2 oz (67.7 kg)  11/26/16 139 lb (63 kg)   She is on OCP,LMP was last week  No concern of pregnancy  Patient Active Problem List   Diagnosis Date Noted  . Nonintractable headache 05/12/2017  . Weight gain 05/12/2017  . High risk sexual behavior 11/26/2016  . Hyperlipidemia, mild 01/18/2016  . Dermatitis 07/16/2015  . Cervical cancer screening 04/29/2014  . Irregular menses 09/30/2013  . Back pain 06/04/2013  . Cerumen impaction 01/29/2013  . Preventative health care 12/25/2011  . ADD (attention deficit disorder)     Past Medical History:  Diagnosis Date  . ADD (attention deficit disorder) 4 th grade  . Cerumen impaction 01/29/2013  . Cervical cancer screening 04/29/2014  . Chicken pox as a child  .  Dermatitis 07/16/2015  . Hyperlipidemia, mild 01/18/2016  . Preventative health care 12/25/2011    Past Surgical History:  Procedure Laterality Date  . TYMPANOSTOMY TUBE PLACEMENT      Social History   Tobacco Use  . Smoking status: Never Smoker  . Smokeless tobacco: Never Used  Substance Use Topics  . Alcohol use: Yes    Alcohol/week: 0.6 oz    Types: 1 Glasses of wine per week    Comment: Social drinker  . Drug use: No    Family History  Problem Relation Age of Onset  . Hyperlipidemia Mother   . Thyroid disease Mother   . Hypertension Father   . Cancer Father 20       testicular  . Cancer Maternal Grandmother        breast- remission; lung cancer  . Stroke Maternal Grandmother   . Hypertension Paternal Grandfather   . Learning disabilities Sister     No Known Allergies  Medication list has been reviewed and updated.  Current Outpatient Medications on File Prior to Visit  Medication Sig Dispense Refill  . Butalbital-Acetaminophen 50-300 MG TABS Take 1 tablet by mouth 3 (three) times daily as needed. 30 tablet 1  . lisdexamfetamine (VYVANSE) 40 MG capsule Take 1  capsule (40 mg total) by mouth every morning. September 2018 30 capsule 0  . lisdexamfetamine (VYVANSE) 40 MG capsule Take 1 capsule (40 mg total) by mouth every morning. August  2018 30 capsule 0  . lisdexamfetamine (VYVANSE) 40 MG capsule Take 1 capsule (40 mg total) by mouth every morning. October  2018 30 capsule 0  . SUMAtriptan (IMITREX) 50 MG tablet Take 0.5-2 tablets (25-100 mg total) by mouth once for 1 dose. May repeat in 2 hours if headache persists or recurs. 15 tablet 1  . VIORELE 0.15-0.02/0.01 MG (21/5) tablet Take 1 tablet by mouth daily. 84 tablet 1   No current facility-administered medications on file prior to visit.     Review of Systems:  As per HPI- otherwise negative. No fever or chills No CP or SOB No abd pain   Physical Examination: Vitals:   07/31/17 1610  BP: 122/84   Pulse: 69  Temp: 98.4 F (36.9 C)  SpO2: 96%   Vitals:   07/31/17 1610  Weight: 162 lb 6.4 oz (73.7 kg)  Height: 5\' 4"  (1.626 m)   Body mass index is 27.88 kg/m. Ideal Body Weight: Weight in (lb) to have BMI = 25: 145.3  GEN: WDWN, NAD, Non-toxic, A & O x 3, overweight, looks well  HEENT: Atraumatic, Normocephalic. Neck supple. No masses, No LAD. Ears and Nose: No external deformity. CV: RRR, No M/G/R. No JVD. No thrill. No extra heart sounds. PULM: CTA B, no wheezes, crackles, rhonchi. No retractions. No resp. distress. No accessory muscle use. ABD: S, NT, ND. No rebound. No HSM.  Belly is benign  EXTR: No c/c/e NEURO Normal gait.  PSYCH: Normally interactive. Conversant. Not depressed or anxious appearing.  Calm demeanor.  Not able to reproduce any tenderness with palpation of both hips, or with ROM Not tender of GT bursa.  Normal flexion, abduction and adduction of both hips.   She indicates that she will feel pain over the anterior hip at the hip flexor muscle insertions, although I cannot reproduce tenderness here on exam   Assessment and Plan: Right hip pain - Plan: meloxicam (MOBIC) 7.5 MG tablet  Hyperkalemia - Plan: Basic metabolic panel  Weight gain - Plan: TSH  Here today with hip pain, right more than left, for about one month.  Do not suspect a serious or dangerous etiology Will have her try mobic for 2 weeks,.  If this does not resolve pain plan for plain films and sports med referral Need to repeat her K level today, will do so Also check TSH  Will plan further follow- up pending labs.   Signed Abbe Amsterdam, MD  Received her labs, message to pt  Your potassium is back to normal!  Thyroid is also normal Often we do see some weight gain when people transition from their teens to 20s.  This can be very frustrating!  You might try using a weight management app on your phone such as myfitnesspal to make sure you are eating the right number of calories  for your exercise habits and weight goals.  Take care and let me know if you need anything further with your hip Results for orders placed or performed in visit on 07/31/17  TSH  Result Value Ref Range   TSH 1.26 0.35 - 4.50 uIU/mL  Basic metabolic panel  Result Value Ref Range   Sodium 138 135 - 145 mEq/L   Potassium 4.3 3.5 - 5.1 mEq/L   Chloride 106 96 - 112  mEq/L   CO2 25 19 - 32 mEq/L   Glucose, Bld 86 70 - 99 mg/dL   BUN 14 6 - 23 mg/dL   Creatinine, Ser 1.610.69 0.40 - 1.20 mg/dL   Calcium 8.7 8.4 - 09.610.5 mg/dL   GFR [id number] >98.11>60.00 mL/min   Message to pt

## 2017-07-31 ENCOUNTER — Encounter: Payer: Self-pay | Admitting: Family Medicine

## 2017-07-31 ENCOUNTER — Ambulatory Visit: Payer: BLUE CROSS/BLUE SHIELD | Admitting: Family Medicine

## 2017-07-31 VITALS — BP 122/84 | HR 69 | Temp 98.4°F | Ht 64.0 in | Wt 162.4 lb

## 2017-07-31 DIAGNOSIS — M25551 Pain in right hip: Secondary | ICD-10-CM

## 2017-07-31 DIAGNOSIS — E875 Hyperkalemia: Secondary | ICD-10-CM

## 2017-07-31 DIAGNOSIS — R635 Abnormal weight gain: Secondary | ICD-10-CM

## 2017-07-31 MED ORDER — MELOXICAM 7.5 MG PO TABS: 7.5000 mg | ORAL_TABLET | Freq: Every day | ORAL | 0 refills | Status: DC

## 2017-07-31 NOTE — Patient Instructions (Signed)
We will try mobic for your hip pain- take it for about 2 weeks. Let me know if this does not resolve your pain We will also repeat your potassium level today and check your thyroid

## 2017-08-01 ENCOUNTER — Encounter: Payer: Self-pay | Admitting: Family Medicine

## 2017-08-01 LAB — TSH: TSH: 1.26 u[IU]/mL (ref 0.35–4.50)

## 2017-08-01 LAB — BASIC METABOLIC PANEL
BUN: 14 mg/dL (ref 6–23)
CHLORIDE: 106 meq/L (ref 96–112)
CO2: 25 meq/L (ref 19–32)
CREATININE: 0.69 mg/dL (ref 0.40–1.20)
Calcium: 8.7 mg/dL (ref 8.4–10.5)
GFR: 110.63 mL/min (ref >60.00)
Glucose, Bld: 86 mg/dL (ref 70–99)
Potassium: 4.3 mEq/L (ref 3.5–5.1)
SODIUM: 138 meq/L (ref 135–145)

## 2017-08-16 NOTE — Progress Notes (Deleted)
[facility] [address] [place], [place] [address] [phone]  Date:  08/18/2017   Name:  Katie Tucker   DOB:  Mar 07, 1991   MRN:  [id number]  PCP:  Bradd Canary, MD    Chief Complaint: No chief complaint on file.   History of Present Illness:  Katie Tucker is a 27 y.o. very pleasant female patient who presents with the following:  Here today with concern of possible yeast vaginitis She has noted  She is on OCP, generally in good health   Patient Active Problem List   Diagnosis Date Noted  . Nonintractable headache 05/12/2017  . Weight gain 05/12/2017  . High risk sexual behavior 11/26/2016  . Hyperlipidemia, mild 01/18/2016  . Dermatitis 07/16/2015  . Cervical cancer screening 04/29/2014  . Irregular menses 09/30/2013  . Back pain 06/04/2013  . Cerumen impaction 01/29/2013  . Preventative health care 12/25/2011  . ADD (attention deficit disorder)     Past Medical History:  Diagnosis Date  . ADD (attention deficit disorder) 4 th grade  . Cerumen impaction 01/29/2013  . Cervical cancer screening 04/29/2014  . Chicken pox as a child  . Dermatitis 07/16/2015  . Hyperlipidemia, mild 01/18/2016  . Preventative health care 12/25/2011    Past Surgical History:  Procedure Laterality Date  . TYMPANOSTOMY TUBE PLACEMENT      Social History   Tobacco Use  . Smoking status: Never Smoker  . Smokeless tobacco: Never Used  Substance Use Topics  . Alcohol use: Yes    Alcohol/week: 0.6 oz    Types: 1 Glasses of wine per week    Comment: Social drinker  . Drug use: No    Family History  Problem Relation Age of Onset  . Hyperlipidemia Mother   . Thyroid disease Mother   . Hypertension Father   . Cancer Father 20       testicular  . Cancer Maternal Grandmother        breast- remission; lung cancer  . Stroke Maternal Grandmother   . Hypertension Paternal Grandfather   . Learning disabilities  Sister     No Known Allergies  Medication list has been reviewed and updated.  Current Outpatient Medications on File Prior to Visit  Medication Sig Dispense Refill  . Butalbital-Acetaminophen 50-300 MG TABS Take 1 tablet by mouth 3 (three) times daily as needed. 30 tablet 1  . lisdexamfetamine (VYVANSE) 40 MG capsule Take 1 capsule (40 mg total) by mouth every morning. September 2018 30 capsule 0  . lisdexamfetamine (VYVANSE) 40 MG capsule Take 1 capsule (40 mg total) by mouth every morning. August  2018 30 capsule 0  . lisdexamfetamine (VYVANSE) 40 MG capsule Take 1 capsule (40 mg total) by mouth every morning. October  2018 30 capsule 0  . meloxicam (MOBIC) 7.5 MG tablet Take 1 tablet (7.5 mg total) by mouth daily. Use as needed for hip pain 30 tablet 0  . SUMAtriptan (IMITREX) 50 MG tablet Take 0.5-2 tablets (25-100 mg total) by mouth once for 1 dose. May repeat in 2 hours if headache persists or recurs. 15 tablet 1  . VIORELE 0.15-0.02/0.01 MG (21/5) tablet Take 1 tablet by mouth daily. 84 tablet 1   No current facility-administered medications on file prior to visit.     Review of Systems:  As per HPI- otherwise negative.   Physical Examination: There were no vitals filed for this visit. There were no  vitals filed for this visit. There is no height or weight on file to calculate BMI. Ideal Body Weight:    GEN: WDWN, NAD, Non-toxic, A & O x 3 HEENT: Atraumatic, Normocephalic. Neck supple. No masses, No LAD. Ears and Nose: No external deformity. CV: RRR, No M/G/R. No JVD. No thrill. No extra heart sounds. PULM: CTA B, no wheezes, crackles, rhonchi. No retractions. No resp. distress. No accessory muscle use. ABD: S, NT, ND, +BS. No rebound. No HSM. EXTR: No c/c/e NEURO Normal gait.  PSYCH: Normally interactive. Conversant. Not depressed or anxious appearing.  Calm demeanor.    Assessment and Plan: ***  Signed Lamar Blinks, MD

## 2017-08-18 ENCOUNTER — Ambulatory Visit: Payer: BLUE CROSS/BLUE SHIELD | Admitting: Family Medicine

## 2017-08-19 ENCOUNTER — Encounter: Payer: Self-pay | Admitting: Family Medicine

## 2017-10-15 ENCOUNTER — Encounter: Payer: Self-pay | Admitting: Family Medicine

## 2017-11-07 NOTE — Telephone Encounter (Signed)
Copied from CRM [id number]. Topic: Inquiry >> Nov 05, 2017  3:28 PM Maia Pettiesrtiz, Kristie S wrote: Reason for CRM: pt is needing TB skin test for a new job. Please advise if we can schedule     -[place], Please schedule nurse visit on a Tuesday or Wednesday for Tb skin test.  Thx,pc

## 2017-11-24 ENCOUNTER — Other Ambulatory Visit: Payer: Self-pay | Admitting: Family Medicine

## 2017-12-09 DIAGNOSIS — M545 Low back pain: Secondary | ICD-10-CM | POA: Diagnosis not present

## 2017-12-30 DIAGNOSIS — M545 Low back pain: Secondary | ICD-10-CM | POA: Diagnosis not present

## 2018-01-01 DIAGNOSIS — M545 Low back pain: Secondary | ICD-10-CM | POA: Diagnosis not present

## 2018-01-06 DIAGNOSIS — M545 Low back pain: Secondary | ICD-10-CM | POA: Diagnosis not present

## 2018-01-08 DIAGNOSIS — M545 Low back pain: Secondary | ICD-10-CM | POA: Diagnosis not present

## 2018-01-13 DIAGNOSIS — M545 Low back pain: Secondary | ICD-10-CM | POA: Diagnosis not present

## 2018-01-15 DIAGNOSIS — M545 Low back pain: Secondary | ICD-10-CM | POA: Diagnosis not present

## 2018-01-20 DIAGNOSIS — M545 Low back pain: Secondary | ICD-10-CM | POA: Diagnosis not present

## 2018-01-27 DIAGNOSIS — M545 Low back pain: Secondary | ICD-10-CM | POA: Diagnosis not present

## 2018-02-03 DIAGNOSIS — M545 Low back pain: Secondary | ICD-10-CM | POA: Diagnosis not present

## 2018-02-12 DIAGNOSIS — M545 Low back pain: Secondary | ICD-10-CM | POA: Diagnosis not present

## 2018-02-18 DIAGNOSIS — M545 Low back pain: Secondary | ICD-10-CM | POA: Diagnosis not present

## 2018-05-11 ENCOUNTER — Other Ambulatory Visit: Payer: Self-pay | Admitting: Family Medicine

## 2018-06-15 ENCOUNTER — Ambulatory Visit: Payer: BLUE CROSS/BLUE SHIELD | Admitting: Family Medicine

## 2018-07-03 ENCOUNTER — Ambulatory Visit (INDEPENDENT_AMBULATORY_CARE_PROVIDER_SITE_OTHER): Payer: BLUE CROSS/BLUE SHIELD | Admitting: Family Medicine

## 2018-07-03 ENCOUNTER — Encounter: Payer: Self-pay | Admitting: Family Medicine

## 2018-07-03 ENCOUNTER — Other Ambulatory Visit (HOSPITAL_COMMUNITY)
Admission: RE | Admit: 2018-07-03 | Discharge: 2018-07-03 | Disposition: A | Discharge status: Home / Self Care | Payer: BLUE CROSS/BLUE SHIELD | Source: Ambulatory Visit | Attending: Family Medicine | Admitting: Family Medicine

## 2018-07-03 ENCOUNTER — Other Ambulatory Visit: Payer: Self-pay

## 2018-07-03 DIAGNOSIS — N926 Irregular menstruation, unspecified: Secondary | ICD-10-CM | POA: Diagnosis not present

## 2018-07-03 DIAGNOSIS — E785 Hyperlipidemia, unspecified: Secondary | ICD-10-CM

## 2018-07-03 DIAGNOSIS — Z7251 High risk heterosexual behavior: Secondary | ICD-10-CM | POA: Diagnosis not present

## 2018-07-03 DIAGNOSIS — Z124 Encounter for screening for malignant neoplasm of cervix: Secondary | ICD-10-CM | POA: Insufficient documentation

## 2018-07-03 DIAGNOSIS — R519 Headache, unspecified: Secondary | ICD-10-CM

## 2018-07-03 DIAGNOSIS — Z0000 Encounter for general adult medical examination without abnormal findings: Secondary | ICD-10-CM | POA: Diagnosis not present

## 2018-07-03 DIAGNOSIS — F988 Other specified behavioral and emotional disorders with onset usually occurring in childhood and adolescence: Secondary | ICD-10-CM

## 2018-07-03 DIAGNOSIS — R51 Headache: Secondary | ICD-10-CM

## 2018-07-03 LAB — COMPREHENSIVE METABOLIC PANEL
ALT: 16 U/L (ref 0–35)
AST: 16 U/L (ref 0–37)
Albumin: 4.3 g/dL (ref 3.5–5.2)
Alkaline Phosphatase: 67 U/L (ref 39–117)
BILIRUBIN TOTAL: 0.5 mg/dL (ref 0.2–1.2)
BUN: 15 mg/dL (ref 6–23)
CO2: 29 mEq/L (ref 19–32)
Calcium: 9.5 mg/dL (ref 8.4–10.5)
Chloride: 104 mEq/L (ref 96–112)
Creatinine, Ser: 0.8 mg/dL (ref 0.40–1.20)
GFR: 87.1 mL/min (ref >60.00)
Glucose, Bld: 94 mg/dL (ref 70–99)
POTASSIUM: 4.1 meq/L (ref 3.5–5.1)
SODIUM: 140 meq/L (ref 135–145)
Total Protein: 6.6 g/dL (ref 6.0–8.3)

## 2018-07-03 LAB — LDL CHOLESTEROL, DIRECT: Direct LDL: 130 mg/dL

## 2018-07-03 LAB — CBC
HCT: 39.8 % (ref 36.0–46.0)
Hemoglobin: 13.4 g/dL (ref 12.0–15.0)
MCHC: 33.8 g/dL (ref 30.0–36.0)
MCV: 91.8 fl (ref 78.0–100.0)
PLATELETS: 197 10*3/uL (ref 150.0–400.0)
RBC: 4.33 Mil/uL (ref 3.87–5.11)
RDW: 13 % (ref 11.5–15.5)
WBC: 6.1 10*3/uL (ref 4.0–10.5)

## 2018-07-03 LAB — TSH: TSH: 0.96 u[IU]/mL (ref 0.35–4.50)

## 2018-07-03 LAB — LIPID PANEL
Cholesterol: 203 mg/dL — ABNORMAL HIGH (ref 0–200)
HDL: 63.6 mg/dL (ref >39.00)
NonHDL: 139.54
Total CHOL/HDL Ratio: 3
Triglycerides: 204 mg/dL — ABNORMAL HIGH (ref 0.0–149.0)
VLDL: 40.8 mg/dL — ABNORMAL HIGH (ref 0.0–40.0)

## 2018-07-03 NOTE — Assessment & Plan Note (Addendum)
STD testing today. No concerning symptoms

## 2018-07-03 NOTE — Assessment & Plan Note (Signed)
Pap today, no concerns on exam.  

## 2018-07-03 NOTE — Assessment & Plan Note (Signed)
Decreased frequency

## 2018-07-03 NOTE — Patient Instructions (Signed)
Preventive Care 18-39 Years, Female Preventive care refers to lifestyle choices and visits with your health care provider that can promote health and wellness. What does preventive care include?   A yearly physical exam. This is also called an annual well check.  Dental exams once or twice a year.  Routine eye exams. Ask your health care provider how often you should have your eyes checked.  Personal lifestyle choices, including: ? Daily care of your teeth and gums. ? Regular physical activity. ? Eating a healthy diet. ? Avoiding tobacco and drug use. ? Limiting alcohol use. ? Practicing safe sex. ? Taking vitamin and mineral supplements as recommended by your health care provider. What happens during an annual well check? The services and screenings done by your health care provider during your annual well check will depend on your age, overall health, lifestyle risk factors, and family history of disease. Counseling Your health care provider may ask you questions about your:  Alcohol use.  Tobacco use.  Drug use.  Emotional well-being.  Home and relationship well-being.  Sexual activity.  Eating habits.  Work and work environment.  Method of birth control.  Menstrual cycle.  Pregnancy history. Screening You may have the following tests or measurements:  Height, weight, and BMI.  Diabetes screening. This is done by checking your blood sugar (glucose) after you have not eaten for a while (fasting).  Blood pressure.  Lipid and cholesterol levels. These may be checked every 5 years starting at age 20.  Skin check.  Hepatitis C blood test.  Hepatitis B blood test.  Sexually transmitted disease (STD) testing.  BRCA-related cancer screening. This may be done if you have a family history of breast, ovarian, tubal, or peritoneal cancers.  Pelvic exam and Pap test. This may be done every 3 years starting at age 21. Starting at age 30, this may be done every 5  years if you have a Pap test in combination with an HPV test. Discuss your test results, treatment options, and if necessary, the need for more tests with your health care provider. Vaccines Your health care provider may recommend certain vaccines, such as:  Influenza vaccine. This is recommended every year.  Tetanus, diphtheria, and acellular pertussis (Tdap, Td) vaccine. You may need a Td booster every 10 years.  Varicella vaccine. You may need this if you have not been vaccinated.  HPV vaccine. If you are 26 or younger, you may need three doses over 6 months.  Measles, mumps, and rubella (MMR) vaccine. You may need at least one dose of MMR. You may also need a second dose.  Pneumococcal 13-valent conjugate (PCV13) vaccine. You may need this if you have certain conditions and were not previously vaccinated.  Pneumococcal polysaccharide (PPSV23) vaccine. You may need one or two doses if you smoke cigarettes or if you have certain conditions.  Meningococcal vaccine. One dose is recommended if you are age 19-21 years and a first-year college student living in a residence hall, or if you have one of several medical conditions. You may also need additional booster doses.  Hepatitis A vaccine. You may need this if you have certain conditions or if you travel or work in places where you may be exposed to hepatitis A.  Hepatitis B vaccine. You may need this if you have certain conditions or if you travel or work in places where you may be exposed to hepatitis B.  Haemophilus influenzae type b (Hib) vaccine. You may need this if you   have certain risk factors. Talk to your health care provider about which screenings and vaccines you need and how often you need them. This information is not intended to replace advice given to you by your health care provider. Make sure you discuss any questions you have with your health care provider. Document Released: 06/04/2001 Document Revised: 11/19/2016  Document Reviewed: 02/07/2015 Elsevier Interactive Patient Education  2019 [employer].

## 2018-07-03 NOTE — Assessment & Plan Note (Signed)
Check bhcg

## 2018-07-03 NOTE — Assessment & Plan Note (Signed)
Patient encouraged to maintain heart healthy diet, regular exercise, adequate sleep. Consider daily probiotics. Take medications as prescribed 

## 2018-07-03 NOTE — Assessment & Plan Note (Signed)
Encouraged heart healthy diet, increase exercise, avoid trans fats, consider a krill oil cap daily 

## 2018-07-05 ENCOUNTER — Encounter: Payer: Self-pay | Admitting: Family Medicine

## 2018-07-05 DIAGNOSIS — R110 Nausea: Secondary | ICD-10-CM

## 2018-07-05 DIAGNOSIS — N926 Irregular menstruation, unspecified: Secondary | ICD-10-CM

## 2018-07-05 NOTE — Assessment & Plan Note (Signed)
Not taking meds at present but will let us know if she returns to school and needs them restarted.

## 2018-07-05 NOTE — Progress Notes (Signed)
Subjective:    [name], female    DOB: July 06, 1990, 28 y.o.   MRN: [id number]  Chief Complaint  Patient presents with  . Annual Exam    with Pap, Pt states having nausea in the morning and states no vomitng, x1-2    HPI Patient is in today for annual preventative exam.  No recent febrile illness or acute hospitalizations.  She is not using her Vyvanse at the current time because she is not in school.  If she returns to school she may restart the medication.  She did have a recent break-up and it has been several weeks.  She acknowledges not using condoms so she is interested in STD testing.  She has no concerns symptom wise.  No fevers, chills, back pain, abdominal pain, discharge or lesions.  Otherwise she feels she is taking good care of herself.  She is trying to maintain a heart healthy diet and stay active.  No significant dysmenorrhea. Denies CP/palp/SOB/HA/congestion/fevers/GI or GU c/o. Taking meds as prescribed  Past Medical History:  Diagnosis Date  . ADD (attention deficit disorder) 4 th grade  . Cerumen impaction 01/29/2013  . Cervical cancer screening 04/29/2014  . Chicken pox as a child  . Dermatitis 07/16/2015  . Hyperlipidemia, mild 01/18/2016  . Preventative health care 12/25/2011    Past Surgical History:  Procedure Laterality Date  . TYMPANOSTOMY TUBE PLACEMENT      Family History  Problem Relation Age of Onset  . Hyperlipidemia Mother   . Thyroid disease Mother   . Hypertension Father   . Cancer Father 20       testicular  . Cancer Maternal Grandmother        breast- remission; lung cancer  . Stroke Maternal Grandmother   . Hypertension Paternal Grandfather   . Learning disabilities Sister     Social History   Socioeconomic History  . Marital status: Single    Spouse name: Not on file  . Number of children: Not on file  . Years of education: Not on file  . Highest education level: Not on file  Occupational History  . Not on file   Social Needs  . Financial resource strain: Not on file  . Food insecurity:    Worry: Not on file    Inability: Not on file  . Transportation needs:    Medical: Not on file    Non-medical: Not on file  Tobacco Use  . Smoking status: Never Smoker  . Smokeless tobacco: Never Used  Substance and Sexual Activity  . Alcohol use: Yes    Alcohol/week: 1.0 standard drinks    Types: 1 Glasses of wine per week    Comment: Social drinker  . Drug use: No  . Sexual activity: Yes    Partners: Male    Comment: lives at home, no dietary restrictions, Junior year in college,  No new partners  Lifestyle  . Physical activity:    Days per week: Not on file    Minutes per session: Not on file  . Stress: Not on file  Relationships  . Social connections:    Talks on phone: Not on file    Gets together: Not on file    Attends religious service: Not on file    Active member of club or organization: Not on file    Attends meetings of clubs or organizations: Not on file    Relationship status: Not on file  . Intimate partner violence:  Fear of current or ex partner: Not on file    Emotionally abused: Not on file    Physically abused: Not on file    Forced sexual activity: Not on file  Other Topics Concern  . Not on file  Social History Narrative  . Not on file    Outpatient Medications Prior to Visit  Medication Sig Dispense Refill  . Butalbital-Acetaminophen 50-300 MG TABS Take 1 tablet by mouth 3 (three) times daily as needed. 30 tablet 1  . meloxicam (MOBIC) 7.5 MG tablet Take 1 tablet (7.5 mg total) by mouth daily. Use as needed for hip pain 30 tablet 0  . VIORELE 0.15-0.02/0.01 MG (21/5) tablet TAKE 1 TABLET DAILY 84 tablet 4  . lisdexamfetamine (VYVANSE) 40 MG capsule Take 1 capsule (40 mg total) by mouth every morning. September 2018 30 capsule 0  . lisdexamfetamine (VYVANSE) 40 MG capsule Take 1 capsule (40 mg total) by mouth every morning. August  2018 30 capsule 0  .  lisdexamfetamine (VYVANSE) 40 MG capsule Take 1 capsule (40 mg total) by mouth every morning. October  2018 30 capsule 0  . SUMAtriptan (IMITREX) 50 MG tablet Take 0.5-2 tablets (25-100 mg total) by mouth once for 1 dose. May repeat in 2 hours if headache persists or recurs. 15 tablet 1   No facility-administered medications prior to visit.     No Known Allergies  Review of Systems  Constitutional: Negative for chills, fever and malaise/fatigue.  HENT: Negative for congestion and hearing loss.   Eyes: Negative for blurred vision and discharge.  Respiratory: Negative for cough, sputum production and shortness of breath.   Cardiovascular: Negative for chest pain, palpitations and leg swelling.  Gastrointestinal: Negative for abdominal pain, blood in stool, constipation, diarrhea, heartburn, nausea and vomiting.  Genitourinary: Negative for dysuria, frequency, hematuria and urgency.  Musculoskeletal: Negative for back pain, falls and myalgias.  Skin: Negative for rash.  Neurological: Negative for dizziness, sensory change, loss of consciousness, weakness and headaches.  Endo/Heme/Allergies: Negative for environmental allergies. Does not bruise/bleed easily.  Psychiatric/Behavioral: Negative for depression and suicidal ideas. The patient is not nervous/anxious and does not have insomnia.        Objective:    Physical Exam Constitutional:      General: She is not in acute distress.    Appearance: She is not diaphoretic.  HENT:     Head: Normocephalic and atraumatic.     Right Ear: External ear normal.     Left Ear: External ear normal.     Nose: Nose normal.     Mouth/Throat:     Pharynx: No oropharyngeal exudate.  Eyes:     General: No scleral icterus.       Right eye: No discharge.        Left eye: No discharge.     Conjunctiva/sclera: Conjunctivae normal.     Pupils: Pupils are equal, round, and reactive to light.  Neck:     Musculoskeletal: Normal range of motion and neck  supple.     Thyroid: No thyromegaly.  Cardiovascular:     Rate and Rhythm: Normal rate and regular rhythm.     Heart sounds: Normal heart sounds. No murmur.  Pulmonary:     Effort: Pulmonary effort is normal. No respiratory distress.     Breath sounds: Normal breath sounds. No wheezing or rales.  Abdominal:     General: Bowel sounds are normal. There is no distension.     Palpations: Abdomen is soft.  There is no mass.     Tenderness: There is no abdominal tenderness.  Genitourinary:    General: Normal vulva.     Vagina: No vaginal discharge.     Rectum: Normal.  Musculoskeletal: Normal range of motion.        General: No tenderness.  Lymphadenopathy:     Cervical: No cervical adenopathy.  Skin:    General: Skin is warm and dry.     Findings: No rash.  Neurological:     Mental Status: She is alert and oriented to person, place, and time.     Cranial Nerves: No cranial nerve deficit.     Coordination: Coordination normal.     Deep Tendon Reflexes: Reflexes are normal and symmetric. Reflexes normal.     BP 100/60 (BP Location: Left Arm, Patient Position: Sitting, Cuff Size: Normal)   Pulse 82   Temp 98.1 F (36.7 C) (Oral)   Resp 18   Ht 5\' 4"  (1.626 m)   Wt 161 lb 9.6 oz (73.3 kg)   LMP 06/24/2018   SpO2 98%   BMI 27.74 kg/m  Wt Readings from Last 3 Encounters:  07/03/18 161 lb 9.6 oz (73.3 kg)  07/31/17 162 lb 6.4 oz (73.7 kg)  05/08/17 149 lb 3.2 oz (67.7 kg)     Lab Results  Component Value Date   WBC 6.1 07/03/2018   HGB 13.4 07/03/2018   HCT 39.8 07/03/2018   PLT 197.0 07/03/2018   GLUCOSE 94 07/03/2018   CHOL 203 (H) 07/03/2018   TRIG 204.0 (H) 07/03/2018   HDL 63.60 07/03/2018   LDLDIRECT 130.0 07/03/2018   LDLCALC 111 (H) 02/11/2014   ALT 16 07/03/2018   AST 16 07/03/2018   NA 140 07/03/2018   K 4.1 07/03/2018   CL 104 07/03/2018   CREATININE 0.80 07/03/2018   BUN 15 07/03/2018   CO2 29 07/03/2018   TSH 0.96 07/03/2018    Lab Results   Component Value Date   TSH 0.96 07/03/2018   Lab Results  Component Value Date   WBC 6.1 07/03/2018   HGB 13.4 07/03/2018   HCT 39.8 07/03/2018   MCV 91.8 07/03/2018   PLT 197.0 07/03/2018   Lab Results  Component Value Date   NA 140 07/03/2018   K 4.1 07/03/2018   CO2 29 07/03/2018   GLUCOSE 94 07/03/2018   BUN 15 07/03/2018   CREATININE 0.80 07/03/2018   BILITOT 0.5 07/03/2018   ALKPHOS 67 07/03/2018   AST 16 07/03/2018   ALT 16 07/03/2018   PROT 6.6 07/03/2018   ALBUMIN 4.3 07/03/2018   CALCIUM 9.5 07/03/2018   GFR 87.10 07/03/2018   Lab Results  Component Value Date   CHOL 203 (H) 07/03/2018   Lab Results  Component Value Date   HDL 63.60 07/03/2018   Lab Results  Component Value Date   LDLCALC 111 (H) 02/11/2014   Lab Results  Component Value Date   TRIG 204.0 (H) 07/03/2018   Lab Results  Component Value Date   CHOLHDL 3 07/03/2018   No results found for: HGBA1C     Assessment & Plan:   Problem List Items Addressed This Visit    ADD (attention deficit disorder)    Not taking meds at present but will let us know if she returns to school and needs them restarted.       Preventative health care    Patient encouraged to maintain heart healthy diet, regular exercise, adequate sleep. Consider daily probiotics.  Take medications as pr.escribed      Relevant Orders   CBC (Completed)   TSH (Completed)   Comprehensive metabolic panel (Completed)   Irregular menses    Check bhcg      Relevant Orders   CBC (Completed)   Cervical cancer screening    Pap today, no concerns on exam.       Relevant Orders   Cytology - PAP( [facility])   Hyperlipidemia, mild    Encouraged heart healthy diet, increase exercise, avoid trans fats, consider a krill oil cap daily      Relevant Orders   Lipid panel (Completed)   High risk sexual behavior    STD testing today. No concerning symptoms      Relevant Orders   Cervicovaginal ancillary only( [facility])   HIV Antibody (routine testing w rflx) (Completed)   RPR   Nonintractable headache    Decreased frequency          I have discontinued Tanikka Sawyer's lisdexamfetamine, lisdexamfetamine, and lisdexamfetamine. I am also having her maintain her Butalbital-Acetaminophen, SUMAtriptan, meloxicam, and Viorele.  No orders of the defined types were placed in this encounter.    Danise Edge, MD

## 2018-07-06 LAB — CERVICOVAGINAL ANCILLARY ONLY
Bacterial vaginitis: NEGATIVE
CANDIDA VAGINITIS: NEGATIVE
Chlamydia: NEGATIVE
Neisseria Gonorrhea: NEGATIVE
TRICH (WINDOWPATH): NEGATIVE

## 2018-07-06 LAB — HIV ANTIBODY (ROUTINE TESTING W REFLEX): HIV 1&2 Ab, 4th Generation: NONREACTIVE

## 2018-07-06 LAB — RPR: RPR Ser Ql: NONREACTIVE

## 2018-07-07 ENCOUNTER — Other Ambulatory Visit: Payer: BLUE CROSS/BLUE SHIELD

## 2018-07-07 DIAGNOSIS — R110 Nausea: Secondary | ICD-10-CM | POA: Diagnosis not present

## 2018-07-07 DIAGNOSIS — N926 Irregular menstruation, unspecified: Secondary | ICD-10-CM

## 2018-07-07 LAB — CYTOLOGY - PAP: Diagnosis: NEGATIVE

## 2018-07-07 NOTE — Telephone Encounter (Signed)
[facility] said specimen is too old for them to add HCG testing but they can still send it out to [facility] for testing if we place future order. Order placed.

## 2018-07-07 NOTE — Telephone Encounter (Signed)
Dr Abner Greenspan -- I can check with [facility] to see if they can add a serum pregnancy test. Would you want the quantitative test?

## 2018-07-08 LAB — HCG, QUANTITATIVE, PREGNANCY: HCG, Total, QN: <2 m[IU]/mL

## 2018-07-21 ENCOUNTER — Encounter: Payer: Self-pay | Admitting: Family Medicine

## 2018-07-23 ENCOUNTER — Other Ambulatory Visit: Payer: Self-pay | Admitting: Family Medicine

## 2018-07-23 MED ORDER — ALUMINUM CHLORIDE 20 % EX SOLN: Freq: Every day | CUTANEOUS | 1 refills | Status: DC

## 2018-08-01 ENCOUNTER — Encounter: Payer: Self-pay | Admitting: Family Medicine

## 2018-10-21 ENCOUNTER — Encounter: Payer: Self-pay | Admitting: Family Medicine

## 2018-10-27 DIAGNOSIS — H52523 Paresis of accommodation, bilateral: Secondary | ICD-10-CM | POA: Diagnosis not present

## 2018-10-29 ENCOUNTER — Other Ambulatory Visit: Payer: Self-pay

## 2018-10-29 ENCOUNTER — Encounter: Payer: Self-pay | Admitting: Family Medicine

## 2018-10-29 ENCOUNTER — Ambulatory Visit: Payer: BC Managed Care – PPO | Admitting: Family Medicine

## 2018-10-29 VITALS — BP 108/62 | HR 89 | Temp 98.4°F | Resp 18 | Wt 160.0 lb

## 2018-10-29 DIAGNOSIS — Z9189 Other specified personal risk factors, not elsewhere classified: Secondary | ICD-10-CM | POA: Diagnosis not present

## 2018-10-29 DIAGNOSIS — F988 Other specified behavioral and emotional disorders with onset usually occurring in childhood and adolescence: Secondary | ICD-10-CM

## 2018-10-29 DIAGNOSIS — Z09 Encounter for follow-up examination after completed treatment for conditions other than malignant neoplasm: Secondary | ICD-10-CM | POA: Diagnosis not present

## 2018-10-29 DIAGNOSIS — Z020 Encounter for examination for admission to educational institution: Secondary | ICD-10-CM | POA: Diagnosis not present

## 2018-10-29 MED ORDER — LISDEXAMFETAMINE DIMESYLATE 20 MG PO CAPS: 20.0000 mg | ORAL_CAPSULE | Freq: Every day | ORAL | 0 refills | Status: DC

## 2018-10-29 NOTE — Patient Instructions (Signed)

## 2018-10-30 LAB — VARICELLA ZOSTER ANTIBODY, IGG: Varicella IgG: 1423 index

## 2018-10-31 ENCOUNTER — Encounter: Payer: Self-pay | Admitting: Family Medicine

## 2018-10-31 LAB — QUANTIFERON-TB GOLD PLUS
Mitogen-NIL: >10 IU/mL
NIL: 0.01 IU/mL
QuantiFERON-TB Gold Plus: NEGATIVE
TB1-NIL: 0.02 IU/mL
TB2-NIL: 0.02 IU/mL

## 2018-11-01 ENCOUNTER — Encounter: Payer: Self-pay | Admitting: Family Medicine

## 2018-11-01 NOTE — Assessment & Plan Note (Signed)
quantefiron and varicella titers checked today.

## 2018-11-01 NOTE — Progress Notes (Signed)
Subjective:    [name], female    DOB: 24-Jun-1990, 28 y.o.   MRN: [id number]  No chief complaint on file.   HPI Patient is in today for follow up on crhonic medical concerns including ADD and to have some testing done that she needs before returning to school. Denies CP/palp/SOB/HA/congestion/fevers/GI or GU c/o. Taking meds as prescribed  Past Medical History:  Diagnosis Date  . ADD (attention deficit disorder) 4 th grade  . Cerumen impaction 01/29/2013  . Cervical cancer screening 04/29/2014  . Chicken pox as a child  . Dermatitis 07/16/2015  . Hyperlipidemia, mild 01/18/2016  . Preventative health care 12/25/2011    Past Surgical History:  Procedure Laterality Date  . TYMPANOSTOMY TUBE PLACEMENT      Family History  Problem Relation Age of Onset  . Hyperlipidemia Mother   . Thyroid disease Mother   . Hypertension Father   . Cancer Father 20       testicular  . Cancer Maternal Grandmother        breast- remission; lung cancer  . Stroke Maternal Grandmother   . Hypertension Paternal Grandfather   . Learning disabilities Sister     Social History   Socioeconomic History  . Marital status: Single    Spouse name: Not on file  . Number of children: Not on file  . Years of education: Not on file  . Highest education level: Not on file  Occupational History  . Not on file  Social Needs  . Financial resource strain: Not on file  . Food insecurity    Worry: Not on file    Inability: Not on file  . Transportation needs    Medical: Not on file    Non-medical: Not on file  Tobacco Use  . Smoking status: Never Smoker  . Smokeless tobacco: Never Used  Substance and Sexual Activity  . Alcohol use: Yes    Alcohol/week: 1.0 standard drinks    Types: 1 Glasses of wine per week    Comment: Social drinker  . Drug use: No  . Sexual activity: Yes    Partners: Male    Comment: lives at home, no dietary restrictions, Junior year in college,  No new  partners  Lifestyle  . Physical activity    Days per week: Not on file    Minutes per session: Not on file  . Stress: Not on file  Relationships  . Social Herbalist on phone: Not on file    Gets together: Not on file    Attends religious service: Not on file    Active member of club or organization: Not on file    Attends meetings of clubs or organizations: Not on file    Relationship status: Not on file  . Intimate partner violence    Fear of current or ex partner: Not on file    Emotionally abused: Not on file    Physically abused: Not on file    Forced sexual activity: Not on file  Other Topics Concern  . Not on file  Social History Narrative  . Not on file    Outpatient Medications Prior to Visit  Medication Sig Dispense Refill  . aluminum chloride (DRYSOL) 20 % external solution Apply topically at bedtime. 35 mL 1  . Butalbital-Acetaminophen 50-300 MG TABS Take 1 tablet by mouth 3 (three) times daily as needed. 30 tablet 1  . meloxicam (MOBIC) 7.5 MG tablet Take 1 tablet (  7.5 mg total) by mouth daily. Use as needed for hip pain 30 tablet 0  . SUMAtriptan (IMITREX) 50 MG tablet Take 0.5-2 tablets (25-100 mg total) by mouth once for 1 dose. May repeat in 2 hours if headache persists or recurs. 15 tablet 1  . VIORELE 0.15-0.02/0.01 MG (21/5) tablet TAKE 1 TABLET DAILY 84 tablet 4   No facility-administered medications prior to visit.     No Known Allergies  Review of Systems  Constitutional: Negative for fever and malaise/fatigue.  HENT: Negative for congestion.   Eyes: Negative for blurred vision.  Respiratory: Negative for shortness of breath.   Cardiovascular: Negative for chest pain, palpitations and leg swelling.  Gastrointestinal: Negative for abdominal pain, blood in stool and nausea.  Genitourinary: Negative for dysuria and frequency.  Musculoskeletal: Negative for falls.  Skin: Negative for rash.  Neurological: Negative for dizziness, loss of  consciousness and headaches.  Endo/Heme/Allergies: Negative for environmental allergies.  Psychiatric/Behavioral: Negative for depression. The patient is not nervous/anxious.        Objective:    Physical Exam Vitals signs and nursing note reviewed.  Constitutional:      General: She is not in acute distress.    Appearance: She is well-developed.  HENT:     Head: Normocephalic and atraumatic.     Nose: Nose normal.  Eyes:     General:        Right eye: No discharge.        Left eye: No discharge.  Neck:     Musculoskeletal: Normal range of motion and neck supple.  Cardiovascular:     Rate and Rhythm: Normal rate and regular rhythm.     Heart sounds: No murmur.  Pulmonary:     Effort: Pulmonary effort is normal.     Breath sounds: Normal breath sounds.  Abdominal:     General: Bowel sounds are normal.     Palpations: Abdomen is soft.     Tenderness: There is no abdominal tenderness.  Skin:    General: Skin is warm and dry.  Neurological:     Mental Status: She is alert and oriented to person, place, and time.     BP 108/62 (BP Location: Left Arm, Patient Position: Sitting, Cuff Size: Normal)   Pulse 89   Temp 98.4 F (36.9 C) (Oral)   Resp 18   Wt 160 lb (72.6 kg)   SpO2 98%   BMI 27.46 kg/m  Wt Readings from Last 3 Encounters:  10/29/18 160 lb (72.6 kg)  07/03/18 161 lb 9.6 oz (73.3 kg)  07/31/17 162 lb 6.4 oz (73.7 kg)    Diabetic Foot Exam - Simple   No data filed     Lab Results  Component Value Date   WBC 6.1 07/03/2018   HGB 13.4 07/03/2018   HCT 39.8 07/03/2018   PLT 197.0 07/03/2018   GLUCOSE 94 07/03/2018   CHOL 203 (H) 07/03/2018   TRIG 204.0 (H) 07/03/2018   HDL 63.60 07/03/2018   LDLDIRECT 130.0 07/03/2018   LDLCALC 111 (H) 02/11/2014   ALT 16 07/03/2018   AST 16 07/03/2018   NA 140 07/03/2018   K 4.1 07/03/2018   CL 104 07/03/2018   CREATININE 0.80 07/03/2018   BUN 15 07/03/2018   CO2 29 07/03/2018   TSH 0.96 07/03/2018     Lab Results  Component Value Date   TSH 0.96 07/03/2018   Lab Results  Component Value Date   WBC 6.1 07/03/2018  HGB 13.4 07/03/2018   HCT 39.8 07/03/2018   MCV 91.8 07/03/2018   PLT 197.0 07/03/2018   Lab Results  Component Value Date   NA 140 07/03/2018   K 4.1 07/03/2018   CO2 29 07/03/2018   GLUCOSE 94 07/03/2018   BUN 15 07/03/2018   CREATININE 0.80 07/03/2018   BILITOT 0.5 07/03/2018   ALKPHOS 67 07/03/2018   AST 16 07/03/2018   ALT 16 07/03/2018   PROT 6.6 07/03/2018   ALBUMIN 4.3 07/03/2018   CALCIUM 9.5 07/03/2018   GFR 87.10 07/03/2018   Lab Results  Component Value Date   CHOL 203 (H) 07/03/2018   Lab Results  Component Value Date   HDL 63.60 07/03/2018   Lab Results  Component Value Date   LDLCALC 111 (H) 02/11/2014   Lab Results  Component Value Date   TRIG 204.0 (H) 07/03/2018   Lab Results  Component Value Date   CHOLHDL 3 07/03/2018   No results found for: HGBA1C     Assessment & Plan:   Problem List Items Addressed This Visit    ADD (attention deficit disorder)    Is going back to school so will restart Vyvanse at 20 mg daily and we will reevaluate      School health examination    quantefiron and varicella titers checked today.        Other Visit Diagnoses    Need for immunization follow-up    -  Primary   Relevant Orders   Varicella zoster antibody, IgG (Completed)   Tuberculosis high risk       Relevant Orders   QuantiFERON-TB Gold Plus (Completed)   Varicella zoster antibody, IgG (Completed)      I am having Joscelynn Boulais start on lisdexamfetamine and lisdexamfetamine. I am also having her maintain her Butalbital-Acetaminophen, SUMAtriptan, meloxicam, Viorele, and aluminum chloride.  Meds ordered this encounter  Medications  . lisdexamfetamine (VYVANSE) 20 MG capsule    Sig: Take 1 capsule (20 mg total) by mouth daily. August 2020    Dispense:  30 capsule    Refill:  0  . lisdexamfetamine (VYVANSE) 20 MG  capsule    Sig: Take 1 capsule (20 mg total) by mouth daily. September 2020    Dispense:  20 capsule    Refill:  0     Danise EdgeStacey Makailey Hodgkin, MD

## 2018-11-01 NOTE — Assessment & Plan Note (Signed)
Is going back to school so will restart Vyvanse at 20 mg daily and we will reevaluate

## 2018-11-05 ENCOUNTER — Encounter: Payer: Self-pay | Admitting: Family Medicine

## 2018-11-27 ENCOUNTER — Telehealth: Payer: Self-pay

## 2018-11-27 ENCOUNTER — Encounter: Payer: Self-pay | Admitting: Family Medicine

## 2018-11-27 NOTE — Telephone Encounter (Signed)
PA initiated via Covermymeds; KEY: [id number]. PA approved.    [id number];WLNLGX:[id number];Review Type:Prior Auth;Coverage Start Date:10/28/2018;Coverage End Date:11/27/2019;

## 2018-12-07 ENCOUNTER — Encounter: Payer: Self-pay | Admitting: Family Medicine

## 2018-12-07 ENCOUNTER — Telehealth: Payer: Self-pay | Admitting: Family Medicine

## 2018-12-07 NOTE — Telephone Encounter (Signed)
Pt called with question about recent labs she need definitive note that states she has immunity or no immunity for school.

## 2018-12-08 ENCOUNTER — Encounter: Payer: Self-pay | Admitting: *Deleted

## 2018-12-08 NOTE — Telephone Encounter (Signed)
See mychart.  

## 2018-12-10 ENCOUNTER — Other Ambulatory Visit: Payer: Self-pay

## 2018-12-10 ENCOUNTER — Encounter: Payer: Self-pay | Admitting: Family Medicine

## 2018-12-10 ENCOUNTER — Ambulatory Visit: Payer: BC Managed Care – PPO | Admitting: Family Medicine

## 2018-12-10 VITALS — BP 120/70 | HR 98 | Temp 97.4°F | Resp 18 | Wt 157.2 lb

## 2018-12-10 DIAGNOSIS — Z23 Encounter for immunization: Secondary | ICD-10-CM

## 2018-12-10 DIAGNOSIS — Z0184 Encounter for antibody response examination: Secondary | ICD-10-CM | POA: Diagnosis not present

## 2018-12-10 DIAGNOSIS — Z020 Encounter for examination for admission to educational institution: Secondary | ICD-10-CM | POA: Diagnosis not present

## 2018-12-10 DIAGNOSIS — R51 Headache: Secondary | ICD-10-CM | POA: Diagnosis not present

## 2018-12-10 DIAGNOSIS — F988 Other specified behavioral and emotional disorders with onset usually occurring in childhood and adolescence: Secondary | ICD-10-CM

## 2018-12-10 DIAGNOSIS — R519 Headache, unspecified: Secondary | ICD-10-CM

## 2018-12-10 MED ORDER — LISDEXAMFETAMINE DIMESYLATE 50 MG PO CAPS: 50.0000 mg | ORAL_CAPSULE | Freq: Every day | ORAL | 0 refills | Status: DC

## 2018-12-10 NOTE — Patient Instructions (Signed)
Preventing Influenza, Adult Influenza, more commonly known as "the flu," is a viral infection that mainly affects the respiratory tract. The respiratory tract includes structures that help you breathe, such as the lungs, nose, and throat. The flu causes many common cold symptoms, as well as a high fever and body aches. The flu spreads easily from person to person (is contagious). The flu is most common from December through March. This is called flu season.You can catch the flu virus by:  Breathing in droplets from an infected person's cough or sneeze.  Touching something that was recently contaminated with the virus and then touching your mouth, nose, or eyes. What can I do to lower my risk?        You can decrease your risk of getting the flu by:  Getting a flu shot (influenza vaccination) every year. This is the best way to prevent the flu. A flu shot is recommended for everyone age 6 months and older. ? It is best to get a flu shot in the fall, as soon as it is available. Getting a flu shot during winter or spring instead is still a good idea. Flu season can last into early spring. ? Preventing the flu through vaccination requires getting a new flu shot every year. This is because the flu virus changes slightly (mutates) from one year to the next. Even if a flu shot does not completely protect you from all flu virus mutations, it can reduce the severity of your illness and prevent dangerous complications of the flu. ? If you are pregnant, you can and should get a flu shot. ? If you have had a reaction to the shot in the past or if you are allergic to eggs, check with your health care provider before getting a flu shot. ? Sometimes the vaccine is available as a nasal spray. In some years, the nasal spray has not been as effective against the flu virus. Check with your health care provider if you have questions about this.  Practicing good health habits. This is especially important during  flu season. ? Avoid contact with people who are sick with flu or cold symptoms. ? Wash your hands with soap and water often. If soap and water are not available, use alcohol-based hand sanitizer. ? Avoid touching your hands to your face, especially when you have not washed your hands recently. ? Use a disinfectant to clean surfaces at home and at work that may be contaminated with the flu virus. ? Keep your body's disease-fighting system (immune system) in good shape by eating a healthy diet, drinking plenty of fluids, getting enough sleep, and exercising regularly. If you do get the flu, avoid spreading it to others by:  Staying home until your symptoms have been gone for at least one day.  Covering your mouth and nose when you cough or sneeze.  Avoiding close contact with others, especially babies and elderly people. Why are these changes important? Getting a flu shot and practicing good health habits protects you as well as other people. If you get the flu, your friends, family, and co-workers are also at risk of getting it, because it spreads so easily to others. Each year, about 2 out of every 10 people get the flu. Having the flu can lead to complications, such as pneumonia, ear infection, and sinus infection. The flu also can be deadly, especially for babies, people older than age 65, and people who have serious long-term diseases. How is this treated? Most   people recover from the flu by resting at home and drinking plenty of fluids. However, a prescription antiviral medicine may reduce your flu symptoms and may make your flu go away sooner. This medicine must be started within a few days of getting flu symptoms. You can talk with your health care provider about whether you need an antiviral medicine. Antiviral medicine may be prescribed for people who are at risk for more serious flu symptoms. This includes people who:  Are older than age 65.  Are pregnant.  Have a condition that  makes the flu worse or more dangerous. Where to find more information  Centers for Disease Control and Prevention: www.cdc.gov/flu/index.htm  Flu.gov: www.flu.gov/prevention-vaccination  American Academy of Family Physicians: familydoctor.org/familydoctor/en/kids/vaccines/preventing-the-flu.html Contact a health care provider if:  You have influenza and you develop new symptoms.  You have: ? Chest pain. ? Diarrhea. ? A fever.  Your cough gets worse, or you produce more mucus. Summary  The best way to prevent the flu is to get a flu shot every year in the fall.  Even if you get the flu after you have received the yearly vaccine, your flu may be milder and go away sooner because of your flu shot.  If you get the flu, antiviral medicines that are started with a few days of symptoms may reduce your flu symptoms and may make your flu go away sooner.  You can also help prevent the flu by practicing good health habits. This information is not intended to replace advice given to you by your health care provider. Make sure you discuss any questions you have with your health care provider. Document Released: 04/23/2015 Document Revised: 03/21/2017 Document Reviewed: 12/16/2015 Elsevier Patient Education  2020 Elsevier Inc.  

## 2018-12-10 NOTE — Assessment & Plan Note (Signed)
Has been taking vyvanse up to 40 mg with partial improvement. Will increase 50 mg daily and reassess

## 2018-12-11 LAB — HEPATITIS B SURFACE ANTIBODY,QUALITATIVE: Hep B S Ab: NONREACTIVE

## 2018-12-13 NOTE — Assessment & Plan Note (Signed)
Needs labs to confirm hepatitis immunity. Ordered today

## 2018-12-13 NOTE — Progress Notes (Signed)
Subjective:    Patient ID: Katie CarneyKaitlin Leary, female    DOB: [birth year], 28 y.o.   MRN: [id number]  Chief Complaint  Patient presents with  . Follow-up    HPI Patient is in today for follow up on her ADD medication changes. She notes the vyvanse has been somewhat helpful as she studies for school but not enough. No headaches that are severe or other concerning side effects. Denies CP/palp/SOB/congestion/fevers/GI or GU c/o. Taking meds as prescribed  Past Medical History:  Diagnosis Date  . ADD (attention deficit disorder) 4 th grade  . Cerumen impaction 01/29/2013  . Cervical cancer screening 04/29/2014  . Chicken pox as a child  . Dermatitis 07/16/2015  . Hyperlipidemia, mild 01/18/2016  . Preventative health care 12/25/2011    Past Surgical History:  Procedure Laterality Date  . TYMPANOSTOMY TUBE PLACEMENT      Family History  Problem Relation Age of Onset  . Hyperlipidemia Mother   . Thyroid disease Mother   . Hypertension Father   . Cancer Father 20       testicular  . Cancer Maternal Grandmother        breast- remission; lung cancer  . Stroke Maternal Grandmother   . Hypertension Paternal Grandfather   . Learning disabilities Sister     Social History   Socioeconomic History  . Marital status: Single    Spouse name: Not on file  . Number of children: Not on file  . Years of education: Not on file  . Highest education level: Not on file  Occupational History  . Not on file  Social Needs  . Financial resource strain: Not on file  . Food insecurity    Worry: Not on file    Inability: Not on file  . Transportation needs    Medical: Not on file    Non-medical: Not on file  Tobacco Use  . Smoking status: Never Smoker  . Smokeless tobacco: Never Used  Substance and Sexual Activity  . Alcohol use: Yes    Alcohol/week: 1.0 standard drinks    Types: 1 Glasses of wine per week    Comment: Social drinker  . Drug use: No  . Sexual activity: Yes    Partners:  Male    Comment: lives at home, no dietary restrictions, Junior year in college,  No new partners  Lifestyle  . Physical activity    Days per week: Not on file    Minutes per session: Not on file  . Stress: Not on file  Relationships  . Social Musicianconnections    Talks on phone: Not on file    Gets together: Not on file    Attends religious service: Not on file    Active member of club or organization: Not on file    Attends meetings of clubs or organizations: Not on file    Relationship status: Not on file  . Intimate partner violence    Fear of current or ex partner: Not on file    Emotionally abused: Not on file    Physically abused: Not on file    Forced sexual activity: Not on file  Other Topics Concern  . Not on file  Social History Narrative  . Not on file    Outpatient Medications Prior to Visit  Medication Sig Dispense Refill  . aluminum chloride (DRYSOL) 20 % external solution Apply topically at bedtime. 35 mL 1  . Butalbital-Acetaminophen 50-300 MG TABS Take 1 tablet by mouth 3 (three)  times daily as needed. 30 tablet 1  . meloxicam (MOBIC) 7.5 MG tablet Take 1 tablet (7.5 mg total) by mouth daily. Use as needed for hip pain 30 tablet 0  . SUMAtriptan (IMITREX) 50 MG tablet Take 0.5-2 tablets (25-100 mg total) by mouth once for 1 dose. May repeat in 2 hours if headache persists or recurs. 15 tablet 1  . VIORELE 0.15-0.02/0.01 MG (21/5) tablet TAKE 1 TABLET DAILY 84 tablet 4  . lisdexamfetamine (VYVANSE) 20 MG capsule Take 1 capsule (20 mg total) by mouth daily. August 2020 30 capsule 0  . lisdexamfetamine (VYVANSE) 20 MG capsule Take 1 capsule (20 mg total) by mouth daily. September 2020 20 capsule 0   No facility-administered medications prior to visit.     No Known Allergies  Review of Systems  Constitutional: Negative for fever and malaise/fatigue.  HENT: Negative for congestion.   Eyes: Negative for blurred vision.  Respiratory: Negative for shortness of breath.    Cardiovascular: Negative for chest pain, palpitations and leg swelling.  Gastrointestinal: Negative for abdominal pain, blood in stool and nausea.  Genitourinary: Negative for dysuria and frequency.  Musculoskeletal: Negative for falls.  Skin: Negative for rash.  Neurological: Positive for headaches. Negative for dizziness and loss of consciousness.  Endo/Heme/Allergies: Negative for environmental allergies.  Psychiatric/Behavioral: Negative for depression. The patient is not nervous/anxious.        Objective:    Physical Exam Vitals signs and nursing note reviewed.  Constitutional:      General: She is not in acute distress.    Appearance: She is well-developed.  HENT:     Head: Normocephalic and atraumatic.     Nose: Nose normal.  Eyes:     General:        Right eye: No discharge.        Left eye: No discharge.  Neck:     Musculoskeletal: Normal range of motion and neck supple.  Cardiovascular:     Rate and Rhythm: Normal rate and regular rhythm.     Heart sounds: No murmur.  Pulmonary:     Effort: Pulmonary effort is normal.     Breath sounds: Normal breath sounds.  Abdominal:     General: Bowel sounds are normal.     Palpations: Abdomen is soft.     Tenderness: There is no abdominal tenderness.  Skin:    General: Skin is warm and dry.  Neurological:     Mental Status: She is alert and oriented to person, place, and time.     BP 120/70 (BP Location: Left Arm, Patient Position: Sitting, Cuff Size: Normal)   Pulse 98   Temp (!) 97.4 F (36.3 C) (Oral)   Resp 18   Wt 157 lb 3.2 oz (71.3 kg)   SpO2 99%   BMI 26.98 kg/m  Wt Readings from Last 3 Encounters:  12/10/18 157 lb 3.2 oz (71.3 kg)  10/29/18 160 lb (72.6 kg)  07/03/18 161 lb 9.6 oz (73.3 kg)    Diabetic Foot Exam - Simple   No data filed     Lab Results  Component Value Date   WBC 6.1 07/03/2018   HGB 13.4 07/03/2018   HCT 39.8 07/03/2018   PLT 197.0 07/03/2018   GLUCOSE 94 07/03/2018    CHOL 203 (H) 07/03/2018   TRIG 204.0 (H) 07/03/2018   HDL 63.60 07/03/2018   LDLDIRECT 130.0 07/03/2018   LDLCALC 111 (H) 02/11/2014   ALT 16 07/03/2018   AST 16 07/03/2018  NA 140 07/03/2018   K 4.1 07/03/2018   CL 104 07/03/2018   CREATININE 0.80 07/03/2018   BUN 15 07/03/2018   CO2 29 07/03/2018   TSH 0.96 07/03/2018    Lab Results  Component Value Date   TSH 0.96 07/03/2018   Lab Results  Component Value Date   WBC 6.1 07/03/2018   HGB 13.4 07/03/2018   HCT 39.8 07/03/2018   MCV 91.8 07/03/2018   PLT 197.0 07/03/2018   Lab Results  Component Value Date   NA 140 07/03/2018   K 4.1 07/03/2018   CO2 29 07/03/2018   GLUCOSE 94 07/03/2018   BUN 15 07/03/2018   CREATININE 0.80 07/03/2018   BILITOT 0.5 07/03/2018   ALKPHOS 67 07/03/2018   AST 16 07/03/2018   ALT 16 07/03/2018   PROT 6.6 07/03/2018   ALBUMIN 4.3 07/03/2018   CALCIUM 9.5 07/03/2018   GFR 87.10 07/03/2018   Lab Results  Component Value Date   CHOL 203 (H) 07/03/2018   Lab Results  Component Value Date   HDL 63.60 07/03/2018   Lab Results  Component Value Date   LDLCALC 111 (H) 02/11/2014   Lab Results  Component Value Date   TRIG 204.0 (H) 07/03/2018   Lab Results  Component Value Date   CHOLHDL 3 07/03/2018   No results found for: HGBA1C     Assessment & Plan:   Problem List Items Addressed This Visit    ADD (attention deficit disorder)    Has been taking vyvanse up to 40 mg with partial improvement. Will increase 50 mg daily and reassess      School health examination    Needs labs to confirm hepatitis immunity. Ordered today      Headache    Encouraged increased hydration, 64 ounces of clear fluids daily. Minimize alcohol and caffeine. Eat small frequent meals with lean proteins and complex carbs. Avoid high and low blood sugars. Get adequate sleep, 7-8 hours a night. Needs exercise daily preferably in the morning.       Other Visit Diagnoses    Need for vaccination  against hepatitis B virus    -  Primary   Relevant Orders   HBsAb Quant HBIG Assessment   HBsAb Quant HBIG Assessment   Hepatitis B surface antibody,qualitative (Completed)   Flu vaccine need       Relevant Orders   Flu Vaccine QUAD 6+ mos PF IM (Fluarix Quad PF) (Completed)      I have discontinued Daniah Celestine's lisdexamfetamine and lisdexamfetamine. I am also having her start on lisdexamfetamine. Additionally, I am having her maintain her Butalbital-Acetaminophen, SUMAtriptan, meloxicam, Viorele, and aluminum chloride.  Meds ordered this encounter  Medications  . lisdexamfetamine (VYVANSE) 50 MG capsule    Sig: Take 1 capsule (50 mg total) by mouth daily.    Dispense:  30 capsule    Refill:  0     Danise EdgeStacey Blyth, MD

## 2018-12-13 NOTE — Assessment & Plan Note (Signed)
Encouraged increased hydration, 64 ounces of clear fluids daily. Minimize alcohol and caffeine. Eat small frequent meals with lean proteins and complex carbs. Avoid high and low blood sugars. Get adequate sleep, 7-8 hours a night. Needs exercise daily preferably in the morning.  

## 2019-01-01 ENCOUNTER — Ambulatory Visit: Payer: BC Managed Care – PPO | Admitting: Family Medicine

## 2019-01-01 ENCOUNTER — Encounter: Payer: Self-pay | Admitting: Family Medicine

## 2019-01-01 NOTE — Telephone Encounter (Signed)
Does she need to start over with the Hep B vaccine Please advise

## 2019-01-06 ENCOUNTER — Other Ambulatory Visit: Payer: Self-pay | Admitting: Family Medicine

## 2019-01-06 ENCOUNTER — Encounter: Payer: Self-pay | Admitting: Family Medicine

## 2019-01-06 MED ORDER — LISDEXAMFETAMINE DIMESYLATE 50 MG PO CAPS: 50.0000 mg | ORAL_CAPSULE | Freq: Every day | ORAL | 0 refills | Status: DC

## 2019-01-08 ENCOUNTER — Encounter: Payer: Self-pay | Admitting: Family Medicine

## 2019-01-11 ENCOUNTER — Telehealth: Payer: Self-pay | Admitting: Family Medicine

## 2019-01-11 NOTE — Telephone Encounter (Signed)
Please write her a school letter that she has Attention Deficit Disorder which may require her to need addition time to complete testing

## 2019-01-11 NOTE — Telephone Encounter (Signed)
Please advise 

## 2019-01-11 NOTE — Telephone Encounter (Signed)
Patient is calling to request a letter for school of a learning disability. Patient is request additional time for testing. Patient is requesti diagnosis also within the letter. Please advise letter is completed. Patient can come pick up CB-  [phone]

## 2019-01-12 NOTE — Telephone Encounter (Signed)
Wrote letter patient notified  Ready for pick up in the front office

## 2019-01-14 ENCOUNTER — Telehealth: Payer: Self-pay | Admitting: Family Medicine

## 2019-01-14 NOTE — Telephone Encounter (Signed)
Forms forwarded to PCP

## 2019-01-14 NOTE — Telephone Encounter (Signed)
Patient dropped off forms to be completed by PCP and faxed to [phone]. Patient will pick up hard copy in the office when completed. Forms are for disability services for ADHD. Placed in provider tray.

## 2019-01-18 ENCOUNTER — Other Ambulatory Visit: Payer: Self-pay | Admitting: Family Medicine

## 2019-01-18 DIAGNOSIS — F988 Other specified behavioral and emotional disorders with onset usually occurring in childhood and adolescence: Secondary | ICD-10-CM

## 2019-01-18 NOTE — Telephone Encounter (Signed)
Per Dr. Charlett Blake patient will need to see a psychiatrist in order for this form to be completed. Spoke with patient and let her know that she needed to see a psychiatrist she voiced her understanding. Patient has a NV in the ofc tomorrow she will pick paper work then.    Dr. Charlett Blake can we place a referral for patient or doe she need a visit  Please advise

## 2019-01-18 NOTE — Telephone Encounter (Signed)
I have placed the referral please let her know and give her [facility] phone number so she can call also this will make things move faster.

## 2019-01-19 ENCOUNTER — Other Ambulatory Visit: Payer: Self-pay

## 2019-01-19 ENCOUNTER — Ambulatory Visit (INDEPENDENT_AMBULATORY_CARE_PROVIDER_SITE_OTHER): Payer: BC Managed Care – PPO | Admitting: *Deleted

## 2019-01-19 DIAGNOSIS — Z23 Encounter for immunization: Secondary | ICD-10-CM

## 2019-01-19 NOTE — Telephone Encounter (Signed)
Patient will be notified at her NV today

## 2019-01-19 NOTE — Progress Notes (Signed)
Patient here for hepatitis vaccine per Dr. Charlett Blake.  Vaccine given in left deltoid and patient tolerated well.  She will see about insurance before she returns for recheck lab in 6-7 weeks.

## 2019-02-05 ENCOUNTER — Encounter: Payer: Self-pay | Admitting: Family Medicine

## 2019-02-08 ENCOUNTER — Encounter: Payer: Self-pay | Admitting: Family Medicine

## 2019-02-09 ENCOUNTER — Other Ambulatory Visit: Payer: Self-pay

## 2019-03-01 ENCOUNTER — Other Ambulatory Visit: Payer: Self-pay | Admitting: Family Medicine

## 2019-03-01 ENCOUNTER — Encounter: Payer: Self-pay | Admitting: Family Medicine

## 2019-03-01 DIAGNOSIS — Z79899 Other long term (current) drug therapy: Secondary | ICD-10-CM

## 2019-03-01 DIAGNOSIS — F988 Other specified behavioral and emotional disorders with onset usually occurring in childhood and adolescence: Secondary | ICD-10-CM

## 2019-03-01 MED ORDER — LISDEXAMFETAMINE DIMESYLATE 50 MG PO CAPS: 50.0000 mg | ORAL_CAPSULE | Freq: Every day | ORAL | 0 refills | Status: DC

## 2019-03-01 MED ORDER — VIORELE 0.15-0.02/0.01 MG (21/5) PO TABS: 1.0000 | ORAL_TABLET | Freq: Every day | ORAL | 3 refills | Status: DC

## 2019-03-02 ENCOUNTER — Other Ambulatory Visit (INDEPENDENT_AMBULATORY_CARE_PROVIDER_SITE_OTHER): Payer: Self-pay

## 2019-03-02 ENCOUNTER — Ambulatory Visit (INDEPENDENT_AMBULATORY_CARE_PROVIDER_SITE_OTHER): Payer: Self-pay

## 2019-03-02 ENCOUNTER — Other Ambulatory Visit: Payer: Self-pay

## 2019-03-02 DIAGNOSIS — F988 Other specified behavioral and emotional disorders with onset usually occurring in childhood and adolescence: Secondary | ICD-10-CM

## 2019-03-02 DIAGNOSIS — Z23 Encounter for immunization: Secondary | ICD-10-CM

## 2019-03-02 DIAGNOSIS — Z1159 Encounter for screening for other viral diseases: Secondary | ICD-10-CM

## 2019-03-02 DIAGNOSIS — Z79899 Other long term (current) drug therapy: Secondary | ICD-10-CM | POA: Diagnosis not present

## 2019-03-02 NOTE — Progress Notes (Deleted)
Patient came in Per Dr. Charlett Blake to have her Hep B/A vaccine completed for school. Patient will need to come one more time 6 months from now to have her last Hep B/A vaccine to complete her series, then 6 weeks after that come in for labs to have the immunity drawn.

## 2019-03-04 LAB — PAIN MGMT, PROFILE 8 W/CONF, U
6 Acetylmorphine: NEGATIVE ng/mL
Alcohol Metabolites: NEGATIVE ng/mL (ref <500)
Amphetamine: 5694 ng/mL
Amphetamines: POSITIVE ng/mL
Benzodiazepines: NEGATIVE ng/mL
Buprenorphine, Urine: NEGATIVE ng/mL
Cocaine Metabolite: NEGATIVE ng/mL
Creatinine: 220.6 mg/dL
MDMA: NEGATIVE ng/mL
Marijuana Metabolite: NEGATIVE ng/mL
Methamphetamine: NEGATIVE ng/mL
Opiates: NEGATIVE ng/mL
Oxidant: NEGATIVE ug/mL
Oxycodone: NEGATIVE ng/mL
pH: 5.8 (ref 4.5–9.0)

## 2019-03-05 ENCOUNTER — Encounter: Payer: Self-pay | Admitting: Family Medicine

## 2019-03-09 ENCOUNTER — Other Ambulatory Visit: Payer: Self-pay

## 2019-03-09 ENCOUNTER — Other Ambulatory Visit: Payer: Self-pay | Admitting: Family Medicine

## 2019-03-09 MED ORDER — LISDEXAMFETAMINE DIMESYLATE 50 MG PO CAPS: 50.0000 mg | ORAL_CAPSULE | Freq: Every day | ORAL | 0 refills | Status: DC

## 2019-03-09 MED ORDER — VIORELE 0.15-0.02/0.01 MG (21/5) PO TABS: 1.0000 | ORAL_TABLET | Freq: Every day | ORAL | 3 refills | Status: DC

## 2019-03-09 NOTE — Telephone Encounter (Signed)
Copied from [place] [id number] [id number]. Topic: General - Other >> Mar 09, 2019 10:07 AM Yvette Rack wrote: Reason for CRM: Pt stated she needs written prescriptions for lisdexamfetamine (VYVANSE) 50 MG capsule and VIORELE 0.15-0.02/0.01 MG (21/5) tablet. Pt requests call back once the prescriptions are ready for pick up     Patient wants a hand rx for these meds can you sign them, Epic will not allow me to sign them

## 2019-03-09 NOTE — Telephone Encounter (Signed)
Medication was printed off for the patient

## 2019-03-15 ENCOUNTER — Telehealth: Payer: Self-pay | Admitting: Family Medicine

## 2019-03-15 ENCOUNTER — Encounter: Payer: Self-pay | Admitting: Family Medicine

## 2019-03-15 NOTE — Telephone Encounter (Signed)
Patient is concerned and has question regarding her hepB series. Please advise CB- [phone]

## 2019-03-16 NOTE — Telephone Encounter (Signed)
Called patient left message for patient to call the office back  

## 2019-05-20 ENCOUNTER — Encounter: Payer: Self-pay | Admitting: Family Medicine

## 2019-05-21 ENCOUNTER — Other Ambulatory Visit: Payer: Self-pay | Admitting: Family Medicine

## 2019-05-21 MED ORDER — LISDEXAMFETAMINE DIMESYLATE 50 MG PO CAPS: 50.0000 mg | ORAL_CAPSULE | Freq: Every day | ORAL | 0 refills | Status: DC

## 2019-07-05 ENCOUNTER — Encounter: Payer: BLUE CROSS/BLUE SHIELD | Admitting: Family Medicine

## 2019-07-16 ENCOUNTER — Encounter: Payer: Self-pay | Admitting: Family Medicine

## 2019-07-18 ENCOUNTER — Other Ambulatory Visit: Payer: Self-pay | Admitting: Family Medicine

## 2019-07-18 MED ORDER — LISDEXAMFETAMINE DIMESYLATE 50 MG PO CAPS: 50.0000 mg | ORAL_CAPSULE | Freq: Every day | ORAL | 0 refills | Status: DC

## 2019-07-19 ENCOUNTER — Encounter: Payer: Self-pay | Admitting: Family Medicine

## 2019-08-02 ENCOUNTER — Encounter: Payer: Self-pay | Admitting: Family Medicine

## 2019-08-02 ENCOUNTER — Other Ambulatory Visit: Payer: Self-pay | Admitting: Family Medicine

## 2019-08-02 MED ORDER — LISDEXAMFETAMINE DIMESYLATE 50 MG PO CAPS: 50.0000 mg | ORAL_CAPSULE | Freq: Every day | ORAL | 0 refills | Status: DC

## 2019-08-12 ENCOUNTER — Telehealth (INDEPENDENT_AMBULATORY_CARE_PROVIDER_SITE_OTHER): Payer: BC Managed Care – PPO | Admitting: Family Medicine

## 2019-08-12 ENCOUNTER — Encounter: Payer: Self-pay | Admitting: Family Medicine

## 2019-08-12 ENCOUNTER — Other Ambulatory Visit: Payer: Self-pay

## 2019-08-12 DIAGNOSIS — F988 Other specified behavioral and emotional disorders with onset usually occurring in childhood and adolescence: Secondary | ICD-10-CM | POA: Diagnosis not present

## 2019-08-12 MED ORDER — LISDEXAMFETAMINE DIMESYLATE 50 MG PO CAPS: 50.0000 mg | ORAL_CAPSULE | Freq: Every day | ORAL | 0 refills | Status: DC

## 2019-08-15 NOTE — Assessment & Plan Note (Addendum)
Doing well on current meds. No recent difficulty. Refills given. UDS up to date

## 2019-08-15 NOTE — Progress Notes (Signed)
Virtual Visit via phone Note  I connected with Katie Tucker on 08/12/19 at  3:20 PM EDT by a phone enabled telemedicine application and verified that I am speaking with the correct person using two identifiers.  Location: Patient: home Provider: office   I discussed the limitations of evaluation and management by telemedicine and the availability of in person appointments. The patient expressed understanding and agreed to proceed. Thelma Barge, CMA was able to get the patient set upon a phone visit after being unable to set up a video visit.      Subjective:    [name], female    DOB: 24-Feb-1991, 29 y.o.   MRN: [id number]  Chief Complaint  Patient presents with  . Medication Refill    HPI Patient is in today for follow up on ADD and medications. No recent febrile illness or hospitalizations. Is undecided about whether she is going to get a COVID vaccination. Denies CP/palp/SOB/HA/congestion/fevers/GI or GU c/o. Taking meds as prescribed  Past Medical History:  Diagnosis Date  . ADD (attention deficit disorder) 4 th grade  . Cerumen impaction 01/29/2013  . Cervical cancer screening 04/29/2014  . Chicken pox as a child  . Dermatitis 07/16/2015  . Hyperlipidemia, mild 01/18/2016  . Preventative health care 12/25/2011    Past Surgical History:  Procedure Laterality Date  . TYMPANOSTOMY TUBE PLACEMENT      Family History  Problem Relation Age of Onset  . Hyperlipidemia Mother   . Thyroid disease Mother   . Hypertension Father   . Cancer Father 20       testicular  . Cancer Maternal Grandmother        breast- remission; lung cancer  . Stroke Maternal Grandmother   . Hypertension Paternal Grandfather   . Learning disabilities Sister     Social History   Socioeconomic History  . Marital status: Single    Spouse name: Not on file  . Number of children: Not on file  . Years of education: Not on file  . Highest education level: Not on file    Occupational History  . Not on file  Tobacco Use  . Smoking status: Never Smoker  . Smokeless tobacco: Never Used  Substance and Sexual Activity  . Alcohol use: Yes    Alcohol/week: 1.0 standard drinks    Types: 1 Glasses of wine per week    Comment: Social drinker  . Drug use: No  . Sexual activity: Yes    Partners: Male    Comment: lives at home, no dietary restrictions, Junior year in college,  No new partners  Other Topics Concern  . Not on file  Social History Narrative  . Not on file   Social Determinants of Health   Financial Resource Strain:   . Difficulty of Paying Living Expenses:   Food Insecurity:   . Worried About Programme researcher, broadcasting/film/video in the Last Year:   . Barista in the Last Year:   Transportation Needs:   . Freight forwarder (Medical):   Marland Kitchen Lack of Transportation (Non-Medical):   Physical Activity:   . Days of Exercise per Week:   . Minutes of Exercise per Session:   Stress:   . Feeling of Stress :   Social Connections:   . Frequency of Communication with Friends and Family:   . Frequency of Social Gatherings with Friends and Family:   . Attends Religious Services:   . Active Member of Clubs or Organizations:   .  Attends Archivist Meetings:   Marland Kitchen Marital Status:   Intimate Partner Violence:   . Fear of Current or Ex-Partner:   . Emotionally Abused:   Marland Kitchen Physically Abused:   . Sexually Abused:     Outpatient Medications Prior to Visit  Medication Sig Dispense Refill  . VIORELE 0.15-0.02/0.01 MG (21/5) tablet Take 1 tablet by mouth daily. 84 tablet 3  . lisdexamfetamine (VYVANSE) 50 MG capsule Take 1 capsule (50 mg total) by mouth daily. February 2021 30 capsule 0  . lisdexamfetamine (VYVANSE) 50 MG capsule Take 1 capsule (50 mg total) by mouth daily. March 2021 30 capsule 0  . lisdexamfetamine (VYVANSE) 50 MG capsule Take 1 capsule (50 mg total) by mouth daily. April  2021 30 capsule 0  . aluminum chloride (DRYSOL) 20 % external  solution Apply topically at bedtime. 35 mL 1  . Butalbital-Acetaminophen 50-300 MG TABS Take 1 tablet by mouth 3 (three) times daily as needed. 30 tablet 1  . meloxicam (MOBIC) 7.5 MG tablet Take 1 tablet (7.5 mg total) by mouth daily. Use as needed for hip pain 30 tablet 0  . SUMAtriptan (IMITREX) 50 MG tablet Take 0.5-2 tablets (25-100 mg total) by mouth once for 1 dose. May repeat in 2 hours if headache persists or recurs. 15 tablet 1   No facility-administered medications prior to visit.    No Known Allergies  Review of Systems  Constitutional: Negative for fever and malaise/fatigue.  HENT: Negative for congestion.   Eyes: Negative for blurred vision.  Respiratory: Negative for shortness of breath.   Cardiovascular: Negative for chest pain, palpitations and leg swelling.  Gastrointestinal: Negative for abdominal pain, blood in stool and nausea.  Genitourinary: Negative for dysuria and frequency.  Musculoskeletal: Negative for falls.  Skin: Negative for rash.  Neurological: Negative for dizziness, loss of consciousness and headaches.  Endo/Heme/Allergies: Negative for environmental allergies.  Psychiatric/Behavioral: Negative for depression. The patient is not nervous/anxious.        Objective:    Physical Exam unable to obtain via phone  There were no vitals taken for this visit. Wt Readings from Last 3 Encounters:  12/10/18 157 lb 3.2 oz (71.3 kg)  10/29/18 160 lb (72.6 kg)  07/03/18 161 lb 9.6 oz (73.3 kg)    Diabetic Foot Exam - Simple   No data filed     Lab Results  Component Value Date   WBC 6.1 07/03/2018   HGB 13.4 07/03/2018   HCT 39.8 07/03/2018   PLT 197.0 07/03/2018   GLUCOSE 94 07/03/2018   CHOL 203 (H) 07/03/2018   TRIG 204.0 (H) 07/03/2018   HDL 63.60 07/03/2018   LDLDIRECT 130.0 07/03/2018   LDLCALC 111 (H) 02/11/2014   ALT 16 07/03/2018   AST 16 07/03/2018   NA 140 07/03/2018   K 4.1 07/03/2018   CL 104 07/03/2018   CREATININE 0.80  07/03/2018   BUN 15 07/03/2018   CO2 29 07/03/2018   TSH 0.96 07/03/2018    Lab Results  Component Value Date   TSH 0.96 07/03/2018   Lab Results  Component Value Date   WBC 6.1 07/03/2018   HGB 13.4 07/03/2018   HCT 39.8 07/03/2018   MCV 91.8 07/03/2018   PLT 197.0 07/03/2018   Lab Results  Component Value Date   NA 140 07/03/2018   K 4.1 07/03/2018   CO2 29 07/03/2018   GLUCOSE 94 07/03/2018   BUN 15 07/03/2018   CREATININE 0.80 07/03/2018   BILITOT  0.5 07/03/2018   ALKPHOS 67 07/03/2018   AST 16 07/03/2018   ALT 16 07/03/2018   PROT 6.6 07/03/2018   ALBUMIN 4.3 07/03/2018   CALCIUM 9.5 07/03/2018   GFR 87.10 07/03/2018   Lab Results  Component Value Date   CHOL 203 (H) 07/03/2018   Lab Results  Component Value Date   HDL 63.60 07/03/2018   Lab Results  Component Value Date   LDLCALC 111 (H) 02/11/2014   Lab Results  Component Value Date   TRIG 204.0 (H) 07/03/2018   Lab Results  Component Value Date   CHOLHDL 3 07/03/2018   No results found for: HGBA1C     Assessment & Plan:   Problem List Items Addressed This Visit    ADD (attention deficit disorder)    Doing well on current meds. No recent difficulty. Refills given. UDS up to date         I have discontinued Mahlet Day's Butalbital-Acetaminophen, SUMAtriptan, meloxicam, and aluminum chloride. I have also changed her lisdexamfetamine, lisdexamfetamine, and lisdexamfetamine. Additionally, I am having her maintain her Viorele.  Meds ordered this encounter  Medications  . lisdexamfetamine (VYVANSE) 50 MG capsule    Sig: Take 1 capsule (50 mg total) by mouth daily. July 2021    Dispense:  30 capsule    Refill:  0  . lisdexamfetamine (VYVANSE) 50 MG capsule    Sig: Take 1 capsule (50 mg total) by mouth daily. June 2021    Dispense:  30 capsule    Refill:  0  . lisdexamfetamine (VYVANSE) 50 MG capsule    Sig: Take 1 capsule (50 mg total) by mouth daily. May  2021    Dispense:  30  capsule    Refill:  0     I discussed the assessment and treatment plan with the patient. The patient was provided an opportunity to ask questions and all were answered. The patient agreed with the plan and demonstrated an understanding of the instructions.   The patient was advised to call back or seek an in-person evaluation if the symptoms worsen or if the condition fails to improve as anticipated.  I provided 15 minutes of non-face-to-face time during this encounter.   Danise Edge, MD

## 2019-08-31 ENCOUNTER — Other Ambulatory Visit: Payer: Self-pay

## 2019-08-31 ENCOUNTER — Ambulatory Visit (INDEPENDENT_AMBULATORY_CARE_PROVIDER_SITE_OTHER): Payer: BC Managed Care – PPO

## 2019-08-31 DIAGNOSIS — Z23 Encounter for immunization: Secondary | ICD-10-CM | POA: Diagnosis not present

## 2019-08-31 NOTE — Progress Notes (Signed)
Patient here today for 3rd hep B vaccine. 1 mL given in right deltoid IM. Patient tolerated well. VIS given.

## 2019-10-12 ENCOUNTER — Ambulatory Visit: Payer: Self-pay

## 2019-11-30 ENCOUNTER — Encounter: Payer: Self-pay | Admitting: Family Medicine

## 2019-12-03 NOTE — Telephone Encounter (Signed)
Spoke with pt. Cancelled appt for 8/18 and r/s with Dr Zola Button for 8/20 at 10:20am.

## 2019-12-08 ENCOUNTER — Ambulatory Visit: Payer: BC Managed Care – PPO | Admitting: Family Medicine

## 2019-12-10 ENCOUNTER — Other Ambulatory Visit: Payer: Self-pay

## 2019-12-10 ENCOUNTER — Ambulatory Visit: Payer: BC Managed Care – PPO | Admitting: Family Medicine

## 2019-12-10 ENCOUNTER — Encounter: Payer: Self-pay | Admitting: Family Medicine

## 2019-12-10 VITALS — BP 110/70 | HR 106 | Temp 97.9°F | Resp 18 | Ht 64.0 in | Wt 142.2 lb

## 2019-12-10 DIAGNOSIS — N390 Urinary tract infection, site not specified: Secondary | ICD-10-CM | POA: Diagnosis not present

## 2019-12-10 DIAGNOSIS — R8290 Unspecified abnormal findings in urine: Secondary | ICD-10-CM | POA: Diagnosis not present

## 2019-12-10 DIAGNOSIS — R82998 Other abnormal findings in urine: Secondary | ICD-10-CM | POA: Diagnosis not present

## 2019-12-10 DIAGNOSIS — R319 Hematuria, unspecified: Secondary | ICD-10-CM

## 2019-12-10 LAB — POC URINALSYSI DIPSTICK (AUTOMATED)
Bilirubin, UA: NEGATIVE
Glucose, UA: NEGATIVE
Ketones, UA: NEGATIVE
Leukocytes, UA: NEGATIVE
Nitrite, UA: POSITIVE
Protein, UA: NEGATIVE
Spec Grav, UA: 1.015 (ref 1.010–1.025)
Urobilinogen, UA: 0.2 E.U./dL
pH, UA: 6.5 (ref 5.0–8.0)

## 2019-12-10 MED ORDER — CEPHALEXIN 500 MG PO CAPS: 500.0000 mg | ORAL_CAPSULE | Freq: Four times a day (QID) | ORAL | 0 refills | Status: DC

## 2019-12-10 NOTE — Patient Instructions (Signed)
Urinary Tract Infection, Adult A urinary tract infection (UTI) is an infection of any part of the urinary tract. The urinary tract includes:  The kidneys.  The ureters.  The bladder.  The urethra. These organs make, store, and get rid of pee (urine) in the body. What are the causes? This is caused by germs (bacteria) in your genital area. These germs grow and cause swelling (inflammation) of your urinary tract. What increases the risk? You are more likely to develop this condition if:  You have a small, thin tube (catheter) to drain pee.  You cannot control when you pee or poop (incontinence).  You are female, and: ? You use these methods to prevent pregnancy:  A medicine that kills sperm (spermicide).  A device that blocks sperm (diaphragm). ? You have low levels of a female hormone (estrogen). ? You are pregnant.  You have genes that add to your risk.  You are sexually active.  You take antibiotic medicines.  You have trouble peeing because of: ? A prostate that is bigger than normal, if you are female. ? A blockage in the part of your body that drains pee from the bladder (urethra). ? A kidney stone. ? A nerve condition that affects your bladder (neurogenic bladder). ? Not getting enough to drink. ? Not peeing often enough.  You have other conditions, such as: ? Diabetes. ? A weak disease-fighting system (immune system). ? Sickle cell disease. ? Gout. ? Injury of the spine. What are the signs or symptoms? Symptoms of this condition include:  Needing to pee right away (urgently).  Peeing often.  Peeing small amounts often.  Pain or burning when peeing.  Blood in the pee.  Pee that smells bad or not like normal.  Trouble peeing.  Pee that is cloudy.  Fluid coming from the vagina, if you are female.  Pain in the belly or lower back. Other symptoms include:  Throwing up (vomiting).  No urge to eat.  Feeling mixed up (confused).  Being tired  and grouchy (irritable).  A fever.  Watery poop (diarrhea). How is this treated? This condition may be treated with:  Antibiotic medicine.  Other medicines.  Drinking enough water. Follow these instructions at home:  Medicines  Take over-the-counter and prescription medicines only as told by your doctor.  If you were prescribed an antibiotic medicine, take it as told by your doctor. Do not stop taking it even if you start to feel better. General instructions  Make sure you: ? Pee until your bladder is empty. ? Do not hold pee for a long time. ? Empty your bladder after sex. ? Wipe from front to back after pooping if you are a female. Use each tissue one time when you wipe.  Drink enough fluid to keep your pee pale yellow.  Keep all follow-up visits as told by your doctor. This is important. Contact a doctor if:  You do not get better after 1-2 days.  Your symptoms go away and then come back. Get help right away if:  You have very bad back pain.  You have very bad pain in your lower belly.  You have a fever.  You are sick to your stomach (nauseous).  You are throwing up. Summary  A urinary tract infection (UTI) is an infection of any part of the urinary tract.  This condition is caused by germs in your genital area.  There are many risk factors for a UTI. These include having a small, thin   tube to drain pee and not being able to control when you pee or poop.  Treatment includes antibiotic medicines for germs.  Drink enough fluid to keep your pee pale yellow. This information is not intended to replace advice given to you by your health care provider. Make sure you discuss any questions you have with your health care provider. Document Revised: 03/26/2018 Document Reviewed: 10/16/2017 Elsevier Patient Education  2020 Elsevier Inc.  

## 2019-12-10 NOTE — Progress Notes (Signed)
[name], female    DOB: October 07, 1990  Age: 29 y.o. MRN: [id number]    Subjective:  Subjective  HPI Katie Tucker presents for urinary odor.  She tried increasing her fluid intake and it did not help-- no dysuria, no blood, no fever.  No abd / back pain   Review of Systems  Constitutional: Negative for appetite change, diaphoresis, fatigue and unexpected weight change.  Eyes: Negative for pain, redness and visual disturbance.  Respiratory: Negative for cough, chest tightness, shortness of breath and wheezing.   Cardiovascular: Negative for chest pain, palpitations and leg swelling.  Gastrointestinal: Negative for abdominal pain, constipation and diarrhea.  Endocrine: Negative for cold intolerance, heat intolerance, polydipsia, polyphagia and polyuria.  Genitourinary: Negative for decreased urine volume, difficulty urinating, dysuria, flank pain, frequency, hematuria, pelvic pain, vaginal bleeding, vaginal discharge and vaginal pain.  Neurological: Negative for dizziness, light-headedness, numbness and headaches.    History Past Medical History:  Diagnosis Date  . ADD (attention deficit disorder) 4 th grade  . Cerumen impaction 01/29/2013  . Cervical cancer screening 04/29/2014  . Chicken pox as a child  . Dermatitis 07/16/2015  . Hyperlipidemia, mild 01/18/2016  . Preventative health care 12/25/2011    She has a past surgical history that includes Tympanostomy tube placement.   Her family history includes Cancer in her maternal grandmother; Cancer (age of onset: 39) in her father; Hyperlipidemia in her mother; Hypertension in her father and paternal grandfather; Learning disabilities in her sister; Stroke in her maternal grandmother; Thyroid disease in her mother.She reports that she has never smoked. She has never used smokeless tobacco. She reports current alcohol use of about 1.0 standard drink of alcohol per week. She reports that she does not use drugs.  Current  Outpatient Medications on File Prior to Visit  Medication Sig Dispense Refill  . lisdexamfetamine (VYVANSE) 50 MG capsule Take 1 capsule (50 mg total) by mouth daily. July 2021 30 capsule 0  . lisdexamfetamine (VYVANSE) 50 MG capsule Take 1 capsule (50 mg total) by mouth daily. June 2021 30 capsule 0  . lisdexamfetamine (VYVANSE) 50 MG capsule Take 1 capsule (50 mg total) by mouth daily. May  2021 30 capsule 0  . VIORELE 0.15-0.02/0.01 MG (21/5) tablet Take 1 tablet by mouth daily. 84 tablet 3   No current facility-administered medications on file prior to visit.     Objective:  Objective  Physical Exam Vitals and nursing note reviewed.  Constitutional:      Appearance: She is well-developed.  HENT:     Head: Normocephalic and atraumatic.  Neck:     Thyroid: No thyromegaly.     Vascular: No carotid bruit or JVD.  Pulmonary:     Effort: Pulmonary effort is normal.  Abdominal:     General: There is no distension.     Tenderness: There is no abdominal tenderness. There is no right CVA tenderness, left CVA tenderness, guarding or rebound.  Musculoskeletal:     Cervical back: Normal range of motion.  Neurological:     Mental Status: She is alert and oriented to person, place, and time.  Psychiatric:        Mood and Affect: Mood normal.        Behavior: Behavior normal.    BP 110/70 (BP Location: Right Arm, Patient Position: Sitting, Cuff Size: Normal)   Pulse (!) 106   Temp 97.9 F (36.6 C) (Oral)   Resp 18   Ht 5\' 4"  (1.626 m)  Wt 142 lb 3.2 oz (64.5 kg)   SpO2 98%   BMI 24.41 kg/m  Wt Readings from Last 3 Encounters:  12/10/19 142 lb 3.2 oz (64.5 kg)  12/10/18 157 lb 3.2 oz (71.3 kg)  10/29/18 160 lb (72.6 kg)     Lab Results  Component Value Date   WBC 6.1 07/03/2018   HGB 13.4 07/03/2018   HCT 39.8 07/03/2018   PLT 197.0 07/03/2018   GLUCOSE 94 07/03/2018   CHOL 203 (H) 07/03/2018   TRIG 204.0 (H) 07/03/2018   HDL 63.60 07/03/2018   LDLDIRECT 130.0  07/03/2018   LDLCALC 111 (H) 02/11/2014   ALT 16 07/03/2018   AST 16 07/03/2018   NA 140 07/03/2018   K 4.1 07/03/2018   CL 104 07/03/2018   CREATININE 0.80 07/03/2018   BUN 15 07/03/2018   CO2 29 07/03/2018   TSH 0.96 07/03/2018    No results found.   Assessment & Plan:  Plan   1. Abnormal urine odor   - POCT Urinalysis Dipstick (Automated) - Urine Culture  2. Urinary tract infection with hematuria, site unspecified Culture pending  - cephALEXin (KEFLEX) 500 MG capsule; Take 1 capsule (500 mg total) by mouth 4 (four) times daily.  Dispense: 14 capsule; Refill: 0    I am having Katie Tucker start on cephALEXin. I am also having her maintain her Viorele, lisdexamfetamine, lisdexamfetamine, and lisdexamfetamine. UA -- + NITRITES AND BLOOD Follow-up: Return if symptoms worsen or fail to improve.  Donato Schultz, DO

## 2019-12-12 LAB — URINE CULTURE
MICRO NUMBER:: 10852778
SPECIMEN QUALITY:: ADEQUATE

## 2019-12-13 ENCOUNTER — Encounter: Payer: Self-pay | Admitting: Family Medicine

## 2019-12-13 ENCOUNTER — Other Ambulatory Visit: Payer: Self-pay

## 2019-12-13 MED ORDER — NITROFURANTOIN MONOHYD MACRO 100 MG PO CAPS: 100.0000 mg | ORAL_CAPSULE | Freq: Two times a day (BID) | ORAL | 0 refills | Status: DC

## 2019-12-22 ENCOUNTER — Other Ambulatory Visit: Payer: Self-pay | Admitting: Family Medicine

## 2019-12-22 ENCOUNTER — Telehealth: Payer: Self-pay | Admitting: Family Medicine

## 2019-12-22 MED ORDER — LISDEXAMFETAMINE DIMESYLATE 50 MG PO CAPS: 50.0000 mg | ORAL_CAPSULE | Freq: Every day | ORAL | 0 refills | Status: DC

## 2019-12-22 NOTE — Telephone Encounter (Signed)
done

## 2019-12-22 NOTE — Telephone Encounter (Signed)
Requesting: vyvanse Contract:n/a UDS:03/02/2019 Last Visit:12/10/19 Next Visit:02/21/20 Last Refill:08/12/19  Please Advise

## 2019-12-22 NOTE — Telephone Encounter (Signed)
Medication: lisdexamfetamine (VYVANSE) 50 MG capsule [[id number]]    Has the patient contacted their pharmacy? No. (If no, request that the patient contact the pharmacy for the refill.) (If yes, when and what did the pharmacy advise?)  Preferred Pharmacy (with phone number or street name):  [id number] - [place], [place] - [address]  [address] [place], [place] [address]  Phone:  [phone] Fax:  [phone]  DEA #:  [id number] Agent: Please be advised that RX refills may take up to 3 business days. We ask that you follow-up with your pharmacy.   Patient states she would like a few on file for pharmacy

## 2020-02-21 ENCOUNTER — Ambulatory Visit (INDEPENDENT_AMBULATORY_CARE_PROVIDER_SITE_OTHER): Payer: BC Managed Care – PPO | Admitting: Family Medicine

## 2020-02-21 ENCOUNTER — Other Ambulatory Visit: Payer: Self-pay

## 2020-02-21 VITALS — BP 104/72 | HR 81 | Temp 98.2°F | Resp 16 | Ht 63.0 in | Wt 143.2 lb

## 2020-02-21 DIAGNOSIS — Z0000 Encounter for general adult medical examination without abnormal findings: Secondary | ICD-10-CM | POA: Diagnosis not present

## 2020-02-21 DIAGNOSIS — M549 Dorsalgia, unspecified: Secondary | ICD-10-CM

## 2020-02-21 DIAGNOSIS — N390 Urinary tract infection, site not specified: Secondary | ICD-10-CM | POA: Insufficient documentation

## 2020-02-21 DIAGNOSIS — F988 Other specified behavioral and emotional disorders with onset usually occurring in childhood and adolescence: Secondary | ICD-10-CM | POA: Diagnosis not present

## 2020-02-21 DIAGNOSIS — N926 Irregular menstruation, unspecified: Secondary | ICD-10-CM

## 2020-02-21 DIAGNOSIS — R3915 Urgency of urination: Secondary | ICD-10-CM | POA: Diagnosis not present

## 2020-02-21 MED ORDER — LISDEXAMFETAMINE DIMESYLATE 50 MG PO CAPS: 50.0000 mg | ORAL_CAPSULE | Freq: Every day | ORAL | 0 refills | Status: DC

## 2020-02-21 NOTE — Patient Instructions (Signed)
[place] for probiotics  [place] tabs  Hydrate 60-80 ounces daily Preventive Care 35-27 Years Old, Female Preventive care refers to visits with your health care provider and lifestyle choices that can promote health and wellness. This includes:  A yearly physical exam. This may also be called an annual well check.  Regular dental visits and eye exams.  Immunizations.  Screening for certain conditions.  Healthy lifestyle choices, such as eating a healthy diet, getting regular exercise, not using drugs or products that contain nicotine and tobacco, and limiting alcohol use. What can I expect for my preventive care visit? Physical exam Your health care provider will check your:  Height and weight. This may be used to calculate body mass index (BMI), which tells if you are at a healthy weight.  Heart rate and blood pressure.  Skin for abnormal spots. Counseling Your health care provider may ask you questions about your:  Alcohol, tobacco, and drug use.  Emotional well-being.  Home and relationship well-being.  Sexual activity.  Eating habits.  Work and work Statistician.  Method of birth control.  Menstrual cycle.  Pregnancy history. What immunizations do I need?  Influenza (flu) vaccine  This is recommended every year. Tetanus, diphtheria, and pertussis (Tdap) vaccine  You may need a Td booster every 10 years. Varicella (chickenpox) vaccine  You may need this if you have not been vaccinated. Human papillomavirus (HPV) vaccine  If recommended by your health care provider, you may need three doses over 6 months. Measles, mumps, and rubella (MMR) vaccine  You may need at least one dose of MMR. You may also need a second dose. Meningococcal conjugate (MenACWY) vaccine  One dose is recommended if you are age 26-21 years and a first-year college student living in a residence hall, or if you have one of several medical conditions. You may also  need additional booster doses. Pneumococcal conjugate (PCV13) vaccine  You may need this if you have certain conditions and were not previously vaccinated. Pneumococcal polysaccharide (PPSV23) vaccine  You may need one or two doses if you smoke cigarettes or if you have certain conditions. Hepatitis A vaccine  You may need this if you have certain conditions or if you travel or work in places where you may be exposed to hepatitis A. Hepatitis B vaccine  You may need this if you have certain conditions or if you travel or work in places where you may be exposed to hepatitis B. Haemophilus influenzae type b (Hib) vaccine  You may need this if you have certain conditions. You may receive vaccines as individual doses or as more than one vaccine together in one shot (combination vaccines). Talk with your health care provider about the risks and benefits of combination vaccines. What tests do I need?  Blood tests  Lipid and cholesterol levels. These may be checked every 5 years starting at age 60.  Hepatitis C test.  Hepatitis B test. Screening  Diabetes screening. This is done by checking your blood sugar (glucose) after you have not eaten for a while (fasting).  Sexually transmitted disease (STD) testing.  BRCA-related cancer screening. This may be done if you have a family history of breast, ovarian, tubal, or peritoneal cancers.  Pelvic exam and Pap test. This may be done every 3 years starting at age 16. Starting at age 18, this may be done every 5 years if you have a Pap test in combination with an HPV test. Talk with your health care provider  about your test results, treatment options, and if necessary, the need for more tests. Follow these instructions at home: Eating and drinking   Eat a diet that includes fresh fruits and vegetables, whole grains, lean protein, and low-fat dairy.  Take vitamin and mineral supplements as recommended by your health care provider.  Do not  drink alcohol if: ? Your health care provider tells you not to drink. ? You are pregnant, may be pregnant, or are planning to become pregnant.  If you drink alcohol: ? Limit how much you have to 0-1 drink a day. ? Be aware of how much alcohol is in your drink. In the [place] one drink equals one 12 oz bottle of beer (355 mL), one 5 oz glass of wine (148 mL), or one 1 oz glass of hard liquor (44 mL). Lifestyle  Take daily care of your teeth and gums.  Stay active. Exercise for at least 30 minutes on 5 or more days each week.  Do not use any products that contain nicotine or tobacco, such as cigarettes, e-cigarettes, and chewing tobacco. If you need help quitting, ask your health care provider.  If you are sexually active, practice safe sex. Use a condom or other form of birth control (contraception) in order to prevent pregnancy and STIs (sexually transmitted infections). If you plan to become pregnant, see your health care provider for a preconception visit. What's next?  Visit your health care provider once a year for a well check visit.  Ask your health care provider how often you should have your eyes and teeth checked.  Stay up to date on all vaccines. This information is not intended to replace advice given to you by your health care provider. Make sure you discuss any questions you have with your health care provider. Document Revised: 12/18/2017 Document Reviewed: 12/18/2017 Elsevier Patient Education  2020 [employer].

## 2020-02-21 NOTE — Assessment & Plan Note (Signed)
Patient encouraged to maintain heart healthy diet, regular exercise, adequate sleep. Consider daily probiotics. Take medications as prescribed. Labs ordered and reviewed 

## 2020-02-21 NOTE — Assessment & Plan Note (Signed)
Encouraged moist heat and gentle stretching as tolerated. May try NSAIDs and prescription meds as directed and report if symptoms worsen or seek immediate care 

## 2020-02-21 NOTE — Assessment & Plan Note (Signed)
Her menses is generally doing well but does occasionally have some spotting that is mild she will let us know if anything worsens.

## 2020-02-21 NOTE — Assessment & Plan Note (Signed)
Had a UTI in August and has been left her with some urgency. Check culture today and a culture order is pending at [facility] for future use. Hydrate, cranberry tabs and probiotics

## 2020-02-21 NOTE — Progress Notes (Signed)
Subjective:    [name], female    DOB: 24-May-1990, 29 y.o.   MRN: [id number]  No chief complaint on file.   HPI Patient is in today for annual preventative exam. No recent febrile illness or hospitalizations. She is trying to stay active and eat well. Denies CP/palp/SOB/HA/congestion/fevers/GI or GU c/o. Taking meds as prescribed. She has chosen not to get a covid shot. She works at a [employer] and it is going well. She has a small bump in her left ear that gets tender at times not presently  Past Medical History:  Diagnosis Date  . ADD (attention deficit disorder) 4 th grade  . Cerumen impaction 01/29/2013  . Cervical cancer screening 04/29/2014  . Chicken pox as a child  . Dermatitis 07/16/2015  . Hyperlipidemia, mild 01/18/2016  . Preventative health care 12/25/2011    Past Surgical History:  Procedure Laterality Date  . TYMPANOSTOMY TUBE PLACEMENT      Family History  Problem Relation Age of Onset  . Hyperlipidemia Mother   . Thyroid disease Mother   . Hypertension Father   . Cancer Father 20       testicular  . Cancer Maternal Grandmother        breast- remission; lung cancer  . Stroke Maternal Grandmother   . Hypertension Paternal Grandfather   . Learning disabilities Sister     Social History   Socioeconomic History  . Marital status: Single    Spouse name: Not on file  . Number of children: Not on file  . Years of education: Not on file  . Highest education level: Not on file  Occupational History  . Not on file  Tobacco Use  . Smoking status: Never Smoker  . Smokeless tobacco: Never Used  Vaping Use  . Vaping Use: Former  Substance and Sexual Activity  . Alcohol use: Yes    Alcohol/week: 1.0 standard drink    Types: 1 Glasses of wine per week    Comment: Social drinker  . Drug use: No  . Sexual activity: Yes    Partners: Male    Comment: lives at home, no dietary restrictions, Junior year in college,  No new partners  Other  Topics Concern  . Not on file  Social History Narrative  . Not on file   Social Determinants of Health   Financial Resource Strain:   . Difficulty of Paying Living Expenses: Not on file  Food Insecurity:   . Worried About Programme researcher, broadcasting/film/video in the Last Year: Not on file  . Ran Out of Food in the Last Year: Not on file  Transportation Needs:   . Lack of Transportation (Medical): Not on file  . Lack of Transportation (Non-Medical): Not on file  Physical Activity:   . Days of Exercise per Week: Not on file  . Minutes of Exercise per Session: Not on file  Stress:   . Feeling of Stress : Not on file  Social Connections:   . Frequency of Communication with Friends and Family: Not on file  . Frequency of Social Gatherings with Friends and Family: Not on file  . Attends Religious Services: Not on file  . Active Member of Clubs or Organizations: Not on file  . Attends Banker Meetings: Not on file  . Marital Status: Not on file  Intimate Partner Violence:   . Fear of Current or Ex-Partner: Not on file  . Emotionally Abused: Not on file  .  Physically Abused: Not on file  . Sexually Abused: Not on file    Outpatient Medications Prior to Visit  Medication Sig Dispense Refill  . lisdexamfetamine (VYVANSE) 50 MG capsule Take 1 capsule (50 mg total) by mouth daily. November 2021 30 capsule 0  . lisdexamfetamine (VYVANSE) 50 MG capsule Take 1 capsule (50 mg total) by mouth daily. October 2021 30 capsule 0  . lisdexamfetamine (VYVANSE) 50 MG capsule Take 1 capsule (50 mg total) by mouth daily. September  2021 30 capsule 0  . nitrofurantoin, macrocrystal-monohydrate, (MACROBID) 100 MG capsule Take 1 capsule (100 mg total) by mouth 2 (two) times daily. 14 capsule 0  . VIORELE 0.15-0.02/0.01 MG (21/5) tablet TAKE 1 TABLET BY MOUTH EVERY DAY 84 tablet 3   No facility-administered medications prior to visit.    No Known Allergies  Review of Systems  Constitutional: Negative  for fever and malaise/fatigue.  HENT: Negative for congestion.   Eyes: Negative for blurred vision.  Respiratory: Negative for shortness of breath.   Cardiovascular: Negative for chest pain, palpitations and leg swelling.  Gastrointestinal: Negative for abdominal pain, blood in stool and nausea.  Genitourinary: Negative for dysuria and frequency.  Musculoskeletal: Negative for falls.  Skin: Negative for rash.  Neurological: Negative for dizziness, loss of consciousness and headaches.  Endo/Heme/Allergies: Negative for environmental allergies.  Psychiatric/Behavioral: Negative for depression. The patient is not nervous/anxious.        Objective:    Physical Exam Vitals and nursing note reviewed.  Constitutional:      General: She is not in acute distress.    Appearance: She is well-developed.  HENT:     Head: Normocephalic and atraumatic.     Ears:     Comments: Small pimple ear opening of left canal, no surrounding erythema or redness    Nose: Nose normal.  Eyes:     General:        Right eye: No discharge.        Left eye: No discharge.  Cardiovascular:     Rate and Rhythm: Normal rate and regular rhythm.     Heart sounds: No murmur heard.   Pulmonary:     Effort: Pulmonary effort is normal.     Breath sounds: Normal breath sounds.  Abdominal:     General: Bowel sounds are normal.     Palpations: Abdomen is soft.     Tenderness: There is no abdominal tenderness.  Musculoskeletal:     Cervical back: Normal range of motion and neck supple.  Skin:    General: Skin is warm and dry.  Neurological:     Mental Status: She is alert and oriented to person, place, and time.     There were no vitals taken for this visit. Wt Readings from Last 3 Encounters:  12/10/19 142 lb 3.2 oz (64.5 kg)  12/10/18 157 lb 3.2 oz (71.3 kg)  10/29/18 160 lb (72.6 kg)    Diabetic Foot Exam - Simple   No data filed     Lab Results  Component Value Date   WBC 6.1 07/03/2018   HGB  13.4 07/03/2018   HCT 39.8 07/03/2018   PLT 197.0 07/03/2018   GLUCOSE 94 07/03/2018   CHOL 203 (H) 07/03/2018   TRIG 204.0 (H) 07/03/2018   HDL 63.60 07/03/2018   LDLDIRECT 130.0 07/03/2018   LDLCALC 111 (H) 02/11/2014   ALT 16 07/03/2018   AST 16 07/03/2018   NA 140 07/03/2018   K 4.1 07/03/2018  CL 104 07/03/2018   CREATININE 0.80 07/03/2018   BUN 15 07/03/2018   CO2 29 07/03/2018   TSH 0.96 07/03/2018    Lab Results  Component Value Date   TSH 0.96 07/03/2018   Lab Results  Component Value Date   WBC 6.1 07/03/2018   HGB 13.4 07/03/2018   HCT 39.8 07/03/2018   MCV 91.8 07/03/2018   PLT 197.0 07/03/2018   Lab Results  Component Value Date   NA 140 07/03/2018   K 4.1 07/03/2018   CO2 29 07/03/2018   GLUCOSE 94 07/03/2018   BUN 15 07/03/2018   CREATININE 0.80 07/03/2018   BILITOT 0.5 07/03/2018   ALKPHOS 67 07/03/2018   AST 16 07/03/2018   ALT 16 07/03/2018   PROT 6.6 07/03/2018   ALBUMIN 4.3 07/03/2018   CALCIUM 9.5 07/03/2018   GFR 87.10 07/03/2018   Lab Results  Component Value Date   CHOL 203 (H) 07/03/2018   Lab Results  Component Value Date   HDL 63.60 07/03/2018   Lab Results  Component Value Date   LDLCALC 111 (H) 02/11/2014   Lab Results  Component Value Date   TRIG 204.0 (H) 07/03/2018   Lab Results  Component Value Date   CHOLHDL 3 07/03/2018   No results found for: HGBA1C     Assessment & Plan:   Problem List Items Addressed This Visit    None      I am having Cherise Lamb maintain her nitrofurantoin (macrocrystal-monohydrate), Viorele, lisdexamfetamine, lisdexamfetamine, and lisdexamfetamine.  No orders of the defined types were placed in this encounter.    Danise Edge, MD

## 2020-02-22 LAB — URINALYSIS
Bilirubin Urine: NEGATIVE
Glucose, UA: NEGATIVE
Ketones, ur: NEGATIVE
Nitrite: NEGATIVE
Protein, ur: NEGATIVE
Specific Gravity, Urine: 1.016 (ref 1.001–1.03)
pH: 6.5 (ref 5.0–8.0)

## 2020-02-24 ENCOUNTER — Telehealth: Payer: Self-pay | Admitting: Family Medicine

## 2020-02-24 ENCOUNTER — Other Ambulatory Visit: Payer: Self-pay | Admitting: Family Medicine

## 2020-02-24 LAB — URINE CULTURE
MICRO NUMBER:: 11142829
SPECIMEN QUALITY:: ADEQUATE

## 2020-02-24 MED ORDER — SULFAMETHOXAZOLE-TRIMETHOPRIM 800-160 MG PO TABS: 1.0000 | ORAL_TABLET | Freq: Two times a day (BID) | ORAL | 0 refills | Status: DC

## 2020-02-24 NOTE — Telephone Encounter (Signed)
Patient is returning a call in regards to her recent labs. Her number is [phone]

## 2020-02-24 NOTE — Progress Notes (Signed)
Attempted to call pt and LVM to call back 

## 2020-02-24 NOTE — Telephone Encounter (Signed)
Attempted to call pt and LVM to call back 

## 2020-02-24 NOTE — Telephone Encounter (Signed)
Pt is aware of labs.

## 2020-02-29 ENCOUNTER — Encounter: Payer: Self-pay | Admitting: Family Medicine

## 2020-04-27 ENCOUNTER — Telehealth: Payer: Self-pay | Admitting: Family Medicine

## 2020-04-27 NOTE — Telephone Encounter (Addendum)
Refills are on file at pharmacy.  [facility]oke [facility]- they will get prescription ready.

## 2020-04-27 NOTE — Telephone Encounter (Signed)
Medication: lisdexamfetamine (VYVANSE) 50 MG capsule [[id number]]       Has the patient contacted their pharmacy?  (If no, request that the patient contact the pharmacy for the refill.) (If yes, when and what did the pharmacy advise?)     Preferred Pharmacy (with phone number or street name):  [id number] - [place], [place] - [address]  [address] [place], [place] [address]  Phone:  [phone] Fax:  [phone]     Agent: Please be advised that RX refills may take up to 3 business days. We ask that you follow-up with your pharmacy.

## 2020-05-03 ENCOUNTER — Telehealth: Payer: Self-pay

## 2020-05-03 NOTE — Telephone Encounter (Signed)
Patient states they need prior-authorization for vyvanse

## 2020-05-03 NOTE — Telephone Encounter (Signed)
PA initiated via Covermymeds; KEY: [id number]. Awaiting determination.

## 2020-05-05 NOTE — Telephone Encounter (Signed)
PA denied. Awaiting denial information.  °

## 2020-05-09 ENCOUNTER — Encounter: Payer: Self-pay | Admitting: Family Medicine

## 2020-05-09 ENCOUNTER — Other Ambulatory Visit: Payer: Self-pay | Admitting: Family Medicine

## 2020-05-09 MED ORDER — METHYLPHENIDATE HCL ER (OSM) 36 MG PO TBCR: 36.0000 mg | EXTENDED_RELEASE_TABLET | Freq: Every day | ORAL | 0 refills | Status: DC

## 2020-05-09 NOTE — Telephone Encounter (Signed)
PA denied. Medications is not on formulary. Formulary alternatives include: amphetamine-dextroamphetamine (generic Adderall XR), dexmethylphenidate HCl ER capsules (generic Focalin XR), dextroamphetamine ER capsule (generic Dexedrine), methylphenidate ER osmotic release (generic Concerta), methylphenidate ER capsule (Metadate CD).   Pt has tried and failed already: generic reg Adderall, Daytrana, methylphenidate, Ritalin LA, and Strattera

## 2020-05-09 NOTE — Telephone Encounter (Signed)
So let patient know her insurance is denying coverage for Vyvanse. She can shop around and download their coupon from their website and pay cash or we can try a different medicine. I know she has tried many so very frustrating but I do not think she has tried Concerta. If she wants to try let me know I will send in new med and then we will need a f/u visit in 4-6 weeks to see how she responded.

## 2020-05-09 NOTE — Telephone Encounter (Signed)
I sent in the Concerta maybe 06/12/20 at 3:20

## 2020-05-09 NOTE — Telephone Encounter (Signed)
Patient stated that she will try concerta and follow up.  If this medication does not work after she follows up she would like for [place] to do an appeal letter.   For follow up will it be ok to do a 320 virtual in about 4 weeks?  Can you review your schedule and see where you want her to go.    Please send message to me to handle.  Thank you.

## 2020-05-10 ENCOUNTER — Other Ambulatory Visit: Payer: Self-pay

## 2020-05-10 NOTE — Telephone Encounter (Signed)
mychart message sent

## 2020-05-15 NOTE — Telephone Encounter (Signed)
Patient called in reference to medication concerns. Patient would like someone to call her back.

## 2020-05-15 NOTE — Telephone Encounter (Signed)
Pt states that she does not have a script for Vyvanse and has not tried the coupon neither.

## 2020-05-16 NOTE — Telephone Encounter (Signed)
Pt not doing well on Concerta. Will attempt an appeal for Vyvanse 50mg . Appeal letter faxed to [facility] at [phone]. Awaiting determination.

## 2020-05-17 ENCOUNTER — Other Ambulatory Visit: Payer: Self-pay | Admitting: Family Medicine

## 2020-05-17 MED ORDER — DEXMETHYLPHENIDATE HCL ER 10 MG PO CP24: 10.0000 mg | ORAL_CAPSULE | Freq: Every day | ORAL | 0 refills | Status: DC

## 2020-05-18 ENCOUNTER — Other Ambulatory Visit: Payer: Self-pay

## 2020-05-18 ENCOUNTER — Ambulatory Visit: Payer: BC Managed Care – PPO | Admitting: Medical

## 2020-05-18 ENCOUNTER — Telehealth: Payer: BC Managed Care – PPO | Admitting: Family Medicine

## 2020-05-18 ENCOUNTER — Ambulatory Visit (HOSPITAL_BASED_OUTPATIENT_CLINIC_OR_DEPARTMENT_OTHER)
Admission: RE | Admit: 2020-05-18 | Discharge: 2020-05-18 | Disposition: A | Discharge status: Home / Self Care | Payer: BC Managed Care – PPO | Source: Ambulatory Visit | Attending: Medical | Admitting: Medical

## 2020-05-18 VITALS — BP 123/82 | HR 117 | Resp 18 | Ht 63.0 in | Wt 144.4 lb

## 2020-05-18 DIAGNOSIS — R319 Hematuria, unspecified: Secondary | ICD-10-CM | POA: Insufficient documentation

## 2020-05-18 DIAGNOSIS — R350 Frequency of micturition: Secondary | ICD-10-CM | POA: Diagnosis not present

## 2020-05-18 DIAGNOSIS — M549 Dorsalgia, unspecified: Secondary | ICD-10-CM | POA: Diagnosis not present

## 2020-05-18 DIAGNOSIS — R109 Unspecified abdominal pain: Secondary | ICD-10-CM | POA: Diagnosis not present

## 2020-05-18 DIAGNOSIS — R8290 Unspecified abnormal findings in urine: Secondary | ICD-10-CM

## 2020-05-18 DIAGNOSIS — M545 Low back pain, unspecified: Secondary | ICD-10-CM

## 2020-05-18 LAB — POC URINALSYSI DIPSTICK (AUTOMATED)
Bilirubin, UA: NEGATIVE
Blood, UA: POSITIVE
Glucose, UA: NEGATIVE
Ketones, UA: NEGATIVE
Nitrite, UA: POSITIVE
Protein, UA: POSITIVE — AB
Spec Grav, UA: 1.015 (ref 1.010–1.025)
Urobilinogen, UA: 1 E.U./dL
pH, UA: 6.5 (ref 5.0–8.0)

## 2020-05-18 MED ORDER — TRAMADOL HCL 50 MG PO TABS: 50.0000 mg | ORAL_TABLET | Freq: Four times a day (QID) | ORAL | 0 refills | Status: AC | PRN

## 2020-05-18 MED ORDER — NITROFURANTOIN MONOHYD MACRO 100 MG PO CAPS: 100.0000 mg | ORAL_CAPSULE | Freq: Two times a day (BID) | ORAL | 0 refills | Status: DC

## 2020-05-18 NOTE — Progress Notes (Signed)
[facility] [address], [address] [place], [place] [address] [phone]  Date:  05/18/2020   Name:  Katie Tucker   DOB:  [birth year]   MRN:  [id number]  PCP:  Bradd Canary, MD    Chief Complaint: possible kidney stone (Low back pain started Tuesday. Pt has tried Ibu 800 and a Naproxen. Pain is 10/10 right now. )   History of Present Illness:  Katie Tucker is a 30 y.o. very pleasant female patient who presents with the following:  Pt of Dr Abner Greenspan here today with concern of abdominal pain  Seen by myself in 2019 Pt location is work, I am at office Pt and myself are present on the visit today- pt ID confirmed with 2 factors and she gives consent for virtual visit today  Today pt notes concern of "possible kidney stones" She notes a lower back pain- feels like a pressure Started 48 hours ago She was not concerned at first as she has scoliosis and does tend to have back pain- however this pain has been more severe and persistent than she is used to  On Tuesday she took ibuprofen which resolved her pain for several hours, but it has come back  Yesterday am the pain got even worse and was causing nausea  Today she continues to have a lot of pain Her lower back is hurting- she cannot determine if worse on one side or the other She felt nauseated yesterday but ok today  No vomiting She is not sure if she might have had a fever last night- she did notice chills No urinary sx noted LMP was 3 weeks ago, she is on OCP  Never had kidney stones The pain does not radiate down her legs She is not having a cough, no ST     Patient Active Problem List   Diagnosis Date Noted  . Urinary urgency 02/21/2020  . Headache 05/12/2017  . Weight gain 05/12/2017  . High risk sexual behavior 11/26/2016  . Hyperlipidemia, mild 01/18/2016  . Dermatitis 07/16/2015  . Cervical cancer screening 04/29/2014  . Irregular menses 09/30/2013  .  Back pain 06/04/2013  . Cerumen impaction 01/29/2013  . Preventative health care 12/25/2011  . ADD (attention deficit disorder)     Past Medical History:  Diagnosis Date  . ADD (attention deficit disorder) 4 th grade  . Cerumen impaction 01/29/2013  . Cervical cancer screening 04/29/2014  . Chicken pox as a child  . Dermatitis 07/16/2015  . Hyperlipidemia, mild 01/18/2016  . Preventative health care 12/25/2011    Past Surgical History:  Procedure Laterality Date  . TYMPANOSTOMY TUBE PLACEMENT      Social History   Tobacco Use  . Smoking status: Never Smoker  . Smokeless tobacco: Never Used  Vaping Use  . Vaping Use: Former  Substance Use Topics  . Alcohol use: Yes    Alcohol/week: 1.0 standard drink    Types: 1 Glasses of wine per week    Comment: Social drinker  . Drug use: No    Family History  Problem Relation Age of Onset  . Hyperlipidemia Mother   . Thyroid disease Mother   . Hypertension Father   . Cancer Father 20       testicular  . Cancer Maternal Grandmother        breast- remission; lung cancer  . Stroke Maternal Grandmother   . Hypertension Paternal Grandfather   . Learning  disabilities Sister     No Known Allergies  Medication list has been reviewed and updated.  Current Outpatient Medications on File Prior to Visit  Medication Sig Dispense Refill  . dexmethylphenidate (FOCALIN XR) 10 MG 24 hr capsule Take 1 capsule (10 mg total) by mouth daily. 30 capsule 0  . VIORELE 0.15-0.02/0.01 MG (21/5) tablet TAKE 1 TABLET BY MOUTH EVERY DAY 84 tablet 3   No current facility-administered medications on file prior to visit.    Review of Systems:  As per HPI- otherwise negative.   Physical Examination: There were no vitals filed for this visit. There were no vitals filed for this visit. There is no height or weight on file to calculate BMI. Ideal Body Weight:     Patient observed via video.  She looks well, no cough, shortness of breath or  distress is noted Assessment and Plan: Acute bilateral low back pain without sciatica  Virtual visit today for concern of severe lower back pain.  She also has noted chills and nausea.  I am concerned that there may be a more serious etiology than musculoskeletal pain in this young female-consider pyelonephritis, pelvic or abdominal pathology.  We have arranged her to be seen in person by my partner Esperanza Richters later this morning.  I have called patient and advised her of this plan, no charge as she will have an in-person visit later today   Signed Abbe Amsterdam, MD

## 2020-05-18 NOTE — Progress Notes (Signed)
Subjective:    [name], female    DOB: 12-17-1990, 30 y.o.   MRN: [id number]  HPI  Pt in today reporting urinary symptoms.   Dysuria- no Frequent urination-some more at night. 2-3 times. Hesitancy-no Suprapubic pressure-no Fever-last night felt warm chills- yes. Last night felt. Nausea- mild this morning. Lmp- came on 2 weeks ago and stated normal. Vomiting-no CVA pain-more on left side. History of UTI-yes. 11-2019 and 02-2020. Gross hematuria-no Cloudy appearance to urine. Mild odor to urine.  Pt is describing at times 10/10 pain over rt cva area but no flank pain. No hx of kidney stones. No blood in urine.   Review of Systems  Constitutional: Negative for chills, fatigue and fever.  Respiratory: Negative for cough, chest tightness, shortness of breath and wheezing.   Cardiovascular: Negative for chest pain and palpitations.  Gastrointestinal: Negative for abdominal pain, blood in stool, constipation and diarrhea.  Genitourinary: Positive for frequency. Negative for difficulty urinating, dysuria, flank pain, hematuria and urgency.  Musculoskeletal: Positive for back pain. Negative for myalgias, neck pain and neck stiffness.  Skin: Negative for rash.  Neurological: Negative for dizziness, seizures, speech difficulty and light-headedness.  Hematological: Negative for adenopathy. Does not bruise/bleed easily.  Psychiatric/Behavioral: Negative for confusion and sleep disturbance. The patient is not nervous/anxious.     Past Medical History:  Diagnosis Date  . ADD (attention deficit disorder) 4 th grade  . Cerumen impaction 01/29/2013  . Cervical cancer screening 04/29/2014  . Chicken pox as a child  . Dermatitis 07/16/2015  . Hyperlipidemia, mild 01/18/2016  . Preventative health care 12/25/2011     Social History   Socioeconomic History  . Marital status: Single    Spouse name: Not on file  . Number of children: Not on file  . Years of education: Not  on file  . Highest education level: Not on file  Occupational History  . Not on file  Tobacco Use  . Smoking status: Never Smoker  . Smokeless tobacco: Never Used  Vaping Use  . Vaping Use: Former  Substance and Sexual Activity  . Alcohol use: Yes    Alcohol/week: 1.0 standard drink    Types: 1 Glasses of wine per week    Comment: Social drinker  . Drug use: No  . Sexual activity: Yes    Partners: Male    Comment: lives at home, no dietary restrictions, Junior year in college,  No new partners  Other Topics Concern  . Not on file  Social History Narrative  . Not on file   Social Determinants of Health   Financial Resource Strain: Not on file  Food Insecurity: Not on file  Transportation Needs: Not on file  Physical Activity: Not on file  Stress: Not on file  Social Connections: Not on file  Intimate Partner Violence: Not on file    Past Surgical History:  Procedure Laterality Date  . TYMPANOSTOMY TUBE PLACEMENT      Family History  Problem Relation Age of Onset  . Hyperlipidemia Mother   . Thyroid disease Mother   . Hypertension Father   . Cancer Father 20       testicular  . Cancer Maternal Grandmother        breast- remission; lung cancer  . Stroke Maternal Grandmother   . Hypertension Paternal Grandfather   . Learning disabilities Sister     No Known Allergies  Current Outpatient Medications on File Prior to Visit  Medication Sig Dispense Refill  .  dexmethylphenidate (FOCALIN XR) 10 MG 24 hr capsule Take 1 capsule (10 mg total) by mouth daily. 30 capsule 0  . VIORELE 0.15-0.02/0.01 MG (21/5) tablet TAKE 1 TABLET BY MOUTH EVERY DAY 84 tablet 3   No current facility-administered medications on file prior to visit.    BP 123/82   Pulse (!) 117   Resp 18   Ht 5\' 3"  (1.6 m)   Wt 144 lb 6.4 oz (65.5 kg)   SpO2 100%   BMI 25.58 kg/m       Objective:   Physical Exam  General- No acute distress. Pleasant patient. Neck- Full range of motion, no  jvd Lungs- Clear, even and unlabored. Heart- regular rate and rhythm. Neurologic- CNII- XII grossly intact.  Abdomen- soft, nt, nd, +bs.  Back- mild rt cva tenderness.      Assessment & Plan:  You appear to have a urinary tract infection. I am prescribing macrobid  antibiotic for the probable infection. Hydrate well. I am sending out a urine culture. During the interim if your signs and symptoms worsen rather than improving please notify . We will notify your when the culture results are back.  Some blood in urine and rt cva area pain. No flank pain so not getting ct renal stone study but ordered 1 view abd xray. May see kidney stone. Rx tramadol to use if needed. Hydrate well.   If pain worsens or changes may need to order ct.  Follow up in 7 days or as needed.

## 2020-05-18 NOTE — Patient Instructions (Signed)
You appear to have a urinary tract infection. I am prescribing macrobid  antibiotic for the probable infection. Hydrate well. I am sending out a urine culture. During the interim if your signs and symptoms worsen rather than improving please notify us. We will notify your when the culture results are back.  Some blood in urine and rt cva area pain. No flank pain so not getting ct renal stone study but ordered 1 view abd xray. May see kidney stone. Rx tramadol to use if needed. Hydrate well.   If pain worsens or changes may need to order ct.  Follow up in 7 days or as needed.

## 2020-05-19 ENCOUNTER — Telehealth: Payer: Self-pay | Admitting: Medical

## 2020-05-19 ENCOUNTER — Encounter: Payer: Self-pay | Admitting: Medical

## 2020-05-19 DIAGNOSIS — R350 Frequency of micturition: Secondary | ICD-10-CM | POA: Diagnosis not present

## 2020-05-19 DIAGNOSIS — R8290 Unspecified abnormal findings in urine: Secondary | ICD-10-CM | POA: Diagnosis not present

## 2020-05-19 MED ORDER — TRAMADOL HCL 50 MG PO TABS
50.0000 mg | ORAL_TABLET | Freq: Four times a day (QID) | ORAL | 0 refills | Status: AC | PRN
Start: 2020-05-19 — End: 2020-05-24

## 2020-05-19 MED ORDER — CEFTRIAXONE SODIUM 1 G IJ SOLR
1.0000 g | Freq: Once | INTRAMUSCULAR | Status: AC
  Administered 2020-05-19: 1 g via INTRAMUSCULAR

## 2020-05-19 NOTE — Addendum Note (Signed)
Addended by: Maximino Sarin on: 05/19/2020 02:49 PM   Modules accepted: Orders

## 2020-05-19 NOTE — Telephone Encounter (Signed)
Pt came into office for injection

## 2020-05-19 NOTE — Telephone Encounter (Addendum)
Would you call patient and arrange for her to come in this afternoon for 1 g Rocephin IM injection.   Talk with patient and her righy CVA area pain has resolved completely.  However she does describe left of mid suprapubic region pain with fever 100.7.  She had hematuria present in urine yesterday.  Culture still pending.  She is on Macrobid.  We will prescribe 10 additional tablets of tramadol in the event she needs over the weekend.  Decided not to do CT renal stone study today since pain is on different side.  Patient is on contraceptives and she states that her cycle 3 weeks ago.  Note also advised if signs/symptoms worsen over the weekend then ED evaluation.

## 2020-05-19 NOTE — Telephone Encounter (Signed)
Patient is calling trying to get a refill on pain medication before the weekend

## 2020-05-19 NOTE — Telephone Encounter (Addendum)
Rx tramadol sent to pt pharmacy.  Note did also explain to pt if signs and symptoms worsen over the weekend then ED evaluation.

## 2020-05-20 ENCOUNTER — Encounter: Payer: Self-pay | Admitting: Medical

## 2020-05-20 LAB — URINE CULTURE
MICRO NUMBER:: 11464914
SPECIMEN QUALITY:: ADEQUATE

## 2020-05-22 ENCOUNTER — Telehealth: Payer: Self-pay | Admitting: Family Medicine

## 2020-05-22 NOTE — Telephone Encounter (Signed)
Pt has appointment tomorrow.

## 2020-05-22 NOTE — Telephone Encounter (Signed)
You do have uti and would expect that you would be getting better with rocephin injection and macrobid. Persisting fever and high level pain makes me consider other issue going on. Full today and tomorrow morning. Can you get her scheduled early tomorrow afternoon. Ok to give her the new pt appointment slot. If her pain worsens before tomorrow then recommend UC with [facility] or ED at [facility].  May need stat imaging studies.

## 2020-05-22 NOTE — Telephone Encounter (Signed)
Patient states she continue to have abdominal pain 8/10, fever 100.7, looking pale and feeling tired. Patient states her urine color has improved. She is wondering if maybe she needs to see a urology. Patient was seeing by Ramon Dredge

## 2020-05-23 ENCOUNTER — Ambulatory Visit: Payer: BC Managed Care – PPO | Admitting: Medical

## 2020-05-23 ENCOUNTER — Encounter: Payer: Self-pay | Admitting: *Deleted

## 2020-05-23 ENCOUNTER — Encounter: Payer: Self-pay | Admitting: Medical

## 2020-05-23 ENCOUNTER — Other Ambulatory Visit: Payer: Self-pay

## 2020-05-23 ENCOUNTER — Telehealth: Payer: Self-pay | Admitting: Medical

## 2020-05-23 ENCOUNTER — Other Ambulatory Visit: Payer: Self-pay | Admitting: Medical

## 2020-05-23 VITALS — BP 117/73 | HR 101 | Temp 98.2°F | Resp 18 | Ht 63.0 in | Wt 144.2 lb

## 2020-05-23 DIAGNOSIS — N390 Urinary tract infection, site not specified: Secondary | ICD-10-CM | POA: Diagnosis not present

## 2020-05-23 DIAGNOSIS — R319 Hematuria, unspecified: Secondary | ICD-10-CM | POA: Diagnosis not present

## 2020-05-23 DIAGNOSIS — R102 Pelvic and perineal pain: Secondary | ICD-10-CM

## 2020-05-23 DIAGNOSIS — R509 Fever, unspecified: Secondary | ICD-10-CM

## 2020-05-23 LAB — POC URINALSYSI DIPSTICK (AUTOMATED)
Bilirubin, UA: NEGATIVE
Blood, UA: POSITIVE
Glucose, UA: NEGATIVE
Ketones, UA: NEGATIVE
Nitrite, UA: NEGATIVE
Protein, UA: POSITIVE — AB
Spec Grav, UA: 1.02 (ref 1.010–1.025)
Urobilinogen, UA: 0.2 E.U./dL
pH, UA: 6 (ref 5.0–8.0)

## 2020-05-23 LAB — POCT URINE PREGNANCY: Preg Test, Ur: NEGATIVE

## 2020-05-23 MED ORDER — CIPROFLOXACIN HCL 500 MG PO TABS: 500.0000 mg | ORAL_TABLET | Freq: Two times a day (BID) | ORAL | 0 refills | Status: DC

## 2020-05-23 MED ORDER — CEFTRIAXONE SODIUM 1 G IJ SOLR
1.0000 g | Freq: Once | INTRAMUSCULAR | Status: AC
  Administered 2020-05-23: 1 g via INTRAMUSCULAR

## 2020-05-23 NOTE — Addendum Note (Signed)
Addended by: Maximino Sarin on: 05/23/2020 02:47 PM   Modules accepted: Orders

## 2020-05-23 NOTE — Telephone Encounter (Signed)
Will you let pt know I want her to take cipro[facility] Will you call downstairs and make sure they have med. Don't want to change med. 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

## 2020-05-23 NOTE — Patient Instructions (Signed)
For uti that still looks persistent on UA advise stop macrobid and start cipro antibiotic. We gave you rocephin 1 gram im.  Your back pain and adnexal pain has resolved. But if this return let me know and well go ahead and proceed with ct renal as discussed.  Will get cbc and cmp today.  Let me know if fever, back pain of abdomen pain returns despite med change.  Follow up 7-10 days or as needed

## 2020-05-23 NOTE — Telephone Encounter (Signed)
Appeal denied. Medication is not on formulary but is covered when ALL alternative medications on the members formulary have been tried and did not work or were not tolerated. She would still need to try and fail Adderall XR, generic Focalin XR (she is currently trying this), generic Dexedrine, Metadate CD.

## 2020-05-23 NOTE — Progress Notes (Signed)
Subjective:    [name], female    DOB: 07-11-1990, 30 y.o.   MRN: [id number]  HPI  Pt in for follow up.  She had + urine culture.   Greater than 100,000 e coli.   She had blood on initial ua. Early on some rt cva area pain but that resolved quickly. Xray of abd one view did not view kidney well do to present of stool.  Pt has some slight left of mid suprapubic area pain late last week. But that pain has resolved. Now just feels sore.   Pt urine still looks infected bu UA. She has been on macrocid. Gave rocephin last week.  preg test  Negative today.   No tramadol for 2 days.     Review of Systems  Constitutional: Positive for fever. Negative for fatigue.       Sweating last night.  Sunday night ran a fever of 100.7.  Respiratory: Negative for cough, chest tightness, shortness of breath and wheezing.   Cardiovascular: Negative for chest pain and palpitations.  Genitourinary: Positive for hematuria. Negative for decreased urine volume, difficulty urinating and dysuria.  Musculoskeletal: Negative for back pain and myalgias.  Neurological: Negative for dizziness, facial asymmetry, speech difficulty, weakness, light-headedness and numbness.  Hematological: Negative for adenopathy. Does not bruise/bleed easily.  Psychiatric/Behavioral: Negative for behavioral problems and confusion.    Past Medical History:  Diagnosis Date  . ADD (attention deficit disorder) 4 th grade  . Cerumen impaction 01/29/2013  . Cervical cancer screening 04/29/2014  . Chicken pox as a child  . Dermatitis 07/16/2015  . Hyperlipidemia, mild 01/18/2016  . Preventative health care 12/25/2011     Social History   Socioeconomic History  . Marital status: Single    Spouse name: Not on file  . Number of children: Not on file  . Years of education: Not on file  . Highest education level: Not on file  Occupational History  . Not on file  Tobacco Use  . Smoking status: Never Smoker  .  Smokeless tobacco: Never Used  Vaping Use  . Vaping Use: Former  Substance and Sexual Activity  . Alcohol use: Yes    Alcohol/week: 1.0 standard drink    Types: 1 Glasses of wine per week    Comment: Social drinker  . Drug use: No  . Sexual activity: Yes    Partners: Male    Comment: lives at home, no dietary restrictions, Junior year in college,  No new partners  Other Topics Concern  . Not on file  Social History Narrative  . Not on file   Social Determinants of Health   Financial Resource Strain: Not on file  Food Insecurity: Not on file  Transportation Needs: Not on file  Physical Activity: Not on file  Stress: Not on file  Social Connections: Not on file  Intimate Partner Violence: Not on file    Past Surgical History:  Procedure Laterality Date  . TYMPANOSTOMY TUBE PLACEMENT      Family History  Problem Relation Age of Onset  . Hyperlipidemia Mother   . Thyroid disease Mother   . Hypertension Father   . Cancer Father 20       testicular  . Cancer Maternal Grandmother        breast- remission; lung cancer  . Stroke Maternal Grandmother   . Hypertension Paternal Grandfather   . Learning disabilities Sister     No Known Allergies  Current Outpatient Medications on  File Prior to Visit  Medication Sig Dispense Refill  . dexmethylphenidate (FOCALIN XR) 10 MG 24 hr capsule Take 1 capsule (10 mg total) by mouth daily. 30 capsule 0  . nitrofurantoin, macrocrystal-monohydrate, (MACROBID) 100 MG capsule Take 1 capsule (100 mg total) by mouth 2 (two) times daily. 14 capsule 0  . traMADol (ULTRAM) 50 MG tablet Take 1 tablet (50 mg total) by mouth every 6 (six) hours as needed for up to 5 days for severe pain. 6 tablet 0  . traMADol (ULTRAM) 50 MG tablet Take 1 tablet (50 mg total) by mouth every 6 (six) hours as needed for up to 5 days for severe pain. 10 tablet 0  . VIORELE 0.15-0.02/0.01 MG (21/5) tablet TAKE 1 TABLET BY MOUTH EVERY DAY 84 tablet 3   No current  facility-administered medications on file prior to visit.    BP 117/73   Pulse (!) 101   Temp 98.2 F (36.8 C) (Oral)   Resp 18   Ht 5\' 3"  (1.6 m)   Wt 144 lb 3.2 oz (65.4 kg)   SpO2 100%   BMI 25.54 kg/m       Objective:   Physical Exam   General- No acute distress. Pleasant patient. Neck- Full range of motion, no jvd Lungs- Clear, even and unlabored. Heart- regular rate and rhythm. Neurologic- CNII- XII grossly intact. Abdomen- soft, nt, nd, +bs, no rebound or guarding.  Back- no cva tenderness.      Assessment & Plan:  For uti that still looks persistent on UA advise stop macrobid and start cipro antibiotic. We gave you rocephin 1 gram im.  Your back pain and adnexal pain has resolved. But if this return let me know and well go ahead and proceed with ct renal as discussed.  Will get cbc and cmp today.  Let me know if fever, back pain of abdomen pain returns despite med change.  Follow up 7-10 days or as needed  , [employer]

## 2020-05-23 NOTE — Telephone Encounter (Signed)
[facility] is unavailable[facility] order[facility]

## 2020-05-23 NOTE — Telephone Encounter (Signed)
Mychart message sent to patient.

## 2020-05-23 NOTE — Telephone Encounter (Signed)
Please see message and let patient know what they said. She may want to call them herself and complain

## 2020-05-24 ENCOUNTER — Encounter: Payer: Self-pay | Admitting: Medical

## 2020-05-24 LAB — COMPREHENSIVE METABOLIC PANEL
ALT: 26 U/L (ref 0–35)
AST: 18 U/L (ref 0–37)
Albumin: 3.6 g/dL (ref 3.5–5.2)
Alkaline Phosphatase: 63 U/L (ref 39–117)
BUN: 17 mg/dL (ref 6–23)
CO2: 28 mEq/L (ref 19–32)
Calcium: 9.3 mg/dL (ref 8.4–10.5)
Chloride: 105 mEq/L (ref 96–112)
Creatinine, Ser: 0.68 mg/dL (ref 0.40–1.20)
GFR: 119.45 mL/min (ref >60.00)
Glucose, Bld: 94 mg/dL (ref 70–99)
Potassium: 4.7 mEq/L (ref 3.5–5.1)
Sodium: 140 mEq/L (ref 135–145)
Total Bilirubin: 0.3 mg/dL (ref 0.2–1.2)
Total Protein: 6.4 g/dL (ref 6.0–8.3)

## 2020-05-24 LAB — CBC WITH DIFFERENTIAL/PLATELET
Basophils Absolute: 0 10*3/uL (ref 0.0–0.1)
Basophils Relative: 0.9 % (ref 0.0–3.0)
Eosinophils Absolute: 0.2 10*3/uL (ref 0.0–0.7)
Eosinophils Relative: 4.3 % (ref 0.0–5.0)
HCT: 36.1 % (ref 36.0–46.0)
Hemoglobin: 12.2 g/dL (ref 12.0–15.0)
Lymphocytes Relative: 28.7 % (ref 12.0–46.0)
Lymphs Abs: 1.2 10*3/uL (ref 0.7–4.0)
MCHC: 34 g/dL (ref 30.0–36.0)
MCV: 91.4 fl (ref 78.0–100.0)
Monocytes Absolute: 0.5 10*3/uL (ref 0.1–1.0)
Monocytes Relative: 10.5 % (ref 3.0–12.0)
Neutro Abs: 2.4 10*3/uL (ref 1.4–7.7)
Neutrophils Relative %: 55.6 % (ref 43.0–77.0)
Platelets: 211 10*3/uL (ref 150.0–400.0)
RBC: 3.95 Mil/uL (ref 3.87–5.11)
RDW: 13 % (ref 11.5–15.5)
WBC: 4.3 10*3/uL (ref 4.0–10.5)

## 2020-05-24 MED FILL — CIPROFLOXACIN HCL 500 MG TA: 500 | 7 days supply | Qty: 14 | Fill #0

## 2020-05-24 NOTE — Telephone Encounter (Signed)
Pt made aware via mychart messages

## 2020-06-02 ENCOUNTER — Telehealth: Payer: Self-pay | Admitting: Family Medicine

## 2020-06-02 NOTE — Telephone Encounter (Signed)
Pt would like placed on a different medication

## 2020-06-02 NOTE — Telephone Encounter (Signed)
Patient states she is not satisfied with the medicine she is currently  on she would like to be put back on adderal and send to    [id number] [place] [place] - [address] [place] [address] Phone:  [phone]  Fax:  [phone]

## 2020-06-04 NOTE — Telephone Encounter (Signed)
Does she want to go back to adderall or move onto the next one she has to fail before we can put her back on Vyvanse? I think she is almost thru the list.

## 2020-06-05 ENCOUNTER — Other Ambulatory Visit: Payer: Self-pay | Admitting: Family Medicine

## 2020-06-05 MED ORDER — AMPHETAMINE-DEXTROAMPHET ER 15 MG PO CP24: 15.0000 mg | ORAL_CAPSULE | ORAL | 0 refills | Status: DC

## 2020-06-05 NOTE — Telephone Encounter (Signed)
It looks like it is only Adderall XR, generic Dexedrine, or Metadate CD left,

## 2020-06-05 NOTE — Telephone Encounter (Signed)
Patient will try the Adderall XR but after that she does want to try anything else.

## 2020-06-05 NOTE — Telephone Encounter (Signed)
Thanks, let her know I sent in Adderall XR 15 mg so we can check another one off her list. If she would prefer for [place] to use the short acting Adderall let me know and we can cancel the xr

## 2020-06-06 ENCOUNTER — Encounter: Payer: Self-pay | Admitting: *Deleted

## 2020-06-07 NOTE — Telephone Encounter (Signed)
Awaiting on signature of provider for new appeal letter.

## 2020-06-08 ENCOUNTER — Encounter: Payer: Self-pay | Admitting: *Deleted

## 2020-06-08 NOTE — Telephone Encounter (Signed)
Letter signed will fax when patient signs her consent form tomorrow.

## 2020-06-12 ENCOUNTER — Telehealth: Payer: BC Managed Care – PPO | Admitting: Family Medicine

## 2020-06-14 ENCOUNTER — Encounter: Payer: Self-pay | Admitting: Family Medicine

## 2020-06-19 ENCOUNTER — Other Ambulatory Visit: Payer: Self-pay | Admitting: Family Medicine

## 2020-06-19 MED ORDER — AMPHETAMINE-DEXTROAMPHET ER 30 MG PO CP24: 30.0000 mg | ORAL_CAPSULE | Freq: Every day | ORAL | 0 refills | Status: DC

## 2020-07-05 ENCOUNTER — Encounter: Payer: Self-pay | Admitting: Family Medicine

## 2020-07-05 ENCOUNTER — Telehealth: Payer: Self-pay | Admitting: Family Medicine

## 2020-07-05 NOTE — Telephone Encounter (Signed)
Patient is requesting a copy of her TB  and all her hepatitis shot she want it sent via email [email or web address]  Please advice

## 2020-07-05 NOTE — Telephone Encounter (Signed)
Immunization record emailed to patient. -JMA

## 2020-07-13 ENCOUNTER — Encounter: Payer: Self-pay | Admitting: *Deleted

## 2020-07-18 ENCOUNTER — Telehealth (INDEPENDENT_AMBULATORY_CARE_PROVIDER_SITE_OTHER): Payer: BC Managed Care – PPO | Admitting: Family Medicine

## 2020-07-18 ENCOUNTER — Other Ambulatory Visit: Payer: Self-pay

## 2020-07-18 DIAGNOSIS — F988 Other specified behavioral and emotional disorders with onset usually occurring in childhood and adolescence: Secondary | ICD-10-CM | POA: Diagnosis not present

## 2020-07-18 DIAGNOSIS — N390 Urinary tract infection, site not specified: Secondary | ICD-10-CM

## 2020-07-18 NOTE — Assessment & Plan Note (Signed)
After failing numerous medications because her insurance forced her to try them she is back on Vyvanse and doing well. She was unable to concentrate in school on the others but is doing well on the Vyvanse.

## 2020-07-18 NOTE — Progress Notes (Signed)
MyChart Video Visit    Virtual Visit via Video Note   This visit type was conducted due to national recommendations for restrictions regarding the COVID-19 Pandemic (e.g. social distancing) in an effort to limit this patient's exposure and mitigate transmission in our community. This patient is at least at moderate risk for complications without adequate follow up. This format is felt to be most appropriate for this patient at this time. Physical exam was limited by quality of the video and audio technology used for the visit. S Chism, CMA was able to get the patient set up on a video visit.  Patient location: her work, Patient and provider in visit Provider location: Office  I discussed the limitations of evaluation and management by telemedicine and the availability of in person appointments. The patient expressed understanding and agreed to proceed.  Visit Date: 07/17/20  Today's healthcare provider: Danise Edge, MD     Subjective:    [name], female    DOB: 1990/07/30, 30 y.o.   MRN: [id number]  Chief Complaint  Patient presents with  . Follow-up    HPI Patient is in today for follow up on medication changes. After tying numerous meds for her ADD because her insurance required it she is back on Vyvanse and doing well. It helps her focus and get thru her training and no side effects. She has had 3 UTIs since August but feels well today. Denies CP/palp/SOB/HA/congestion/fevers/GI or GU c/o. Taking meds as prescribed  Past Medical History:  Diagnosis Date  . ADD (attention deficit disorder) 4 th grade  . Cerumen impaction 01/29/2013  . Cervical cancer screening 04/29/2014  . Chicken pox as a child  . Dermatitis 07/16/2015  . Hyperlipidemia, mild 01/18/2016  . Preventative health care 12/25/2011    Past Surgical History:  Procedure Laterality Date  . TYMPANOSTOMY TUBE PLACEMENT      Family History  Problem Relation Age of Onset  . Hyperlipidemia  Mother   . Thyroid disease Mother   . Hypertension Father   . Cancer Father 20       testicular  . Cancer Maternal Grandmother        breast- remission; lung cancer  . Stroke Maternal Grandmother   . Hypertension Paternal Grandfather   . Learning disabilities Sister     Social History   Socioeconomic History  . Marital status: Single    Spouse name: Not on file  . Number of children: Not on file  . Years of education: Not on file  . Highest education level: Not on file  Occupational History  . Not on file  Tobacco Use  . Smoking status: Never Smoker  . Smokeless tobacco: Never Used  Vaping Use  . Vaping Use: Former  Substance and Sexual Activity  . Alcohol use: Yes    Alcohol/week: 1.0 standard drink    Types: 1 Glasses of wine per week    Comment: Social drinker  . Drug use: No  . Sexual activity: Yes    Partners: Male    Comment: lives at home, no dietary restrictions, Junior year in college,  No new partners  Other Topics Concern  . Not on file  Social History Narrative  . Not on file   Social Determinants of Health   Financial Resource Strain: Not on file  Food Insecurity: Not on file  Transportation Needs: Not on file  Physical Activity: Not on file  Stress: Not on file  Social Connections: Not on  file  Intimate Partner Violence: Not on file    Outpatient Medications Prior to Visit  Medication Sig Dispense Refill  . ciprofloxacin (CIPRO) 500 MG tablet Take 1 tablet (500 mg total) by mouth 2 (two) times daily. 14 tablet 0  . lisdexamfetamine (VYVANSE) 50 MG capsule Take 50 mg by mouth daily.    . nitrofurantoin, macrocrystal-monohydrate, (MACROBID) 100 MG capsule Take 1 capsule (100 mg total) by mouth 2 (two) times daily. 14 capsule 0  . VIORELE 0.15-0.02/0.01 MG (21/5) tablet TAKE 1 TABLET BY MOUTH EVERY DAY 84 tablet 3   No facility-administered medications prior to visit.    No Known Allergies  Review of Systems  Constitutional: Negative for  chills, fever and malaise/fatigue.  HENT: Negative for congestion, sinus pain and sore throat.   Eyes: Negative for blurred vision.  Respiratory: Negative for cough and shortness of breath.   Cardiovascular: Negative for chest pain, palpitations and leg swelling.  Gastrointestinal: Negative for abdominal pain, blood in stool, diarrhea, nausea and vomiting.  Genitourinary: Negative for dysuria, flank pain and frequency.  Musculoskeletal: Negative for back pain and falls.  Skin: Negative for rash.  Neurological: Negative for dizziness, loss of consciousness and headaches.  Endo/Heme/Allergies: Negative for environmental allergies.  Psychiatric/Behavioral: Negative for depression. The patient is not nervous/anxious.        Objective:    Physical Exam Constitutional:      Appearance: Normal appearance. She is not ill-appearing.  HENT:     Head: Normocephalic and atraumatic.     Right Ear: External ear normal.     Left Ear: External ear normal.     Nose: Nose normal.  Eyes:     General:        Right eye: No discharge.        Left eye: No discharge.  Pulmonary:     Effort: Pulmonary effort is normal.  Neurological:     Mental Status: She is alert and oriented to person, place, and time.  Psychiatric:        Behavior: Behavior normal.     There were no vitals taken for this visit. Wt Readings from Last 3 Encounters:  05/23/20 144 lb 3.2 oz (65.4 kg)  05/18/20 144 lb 6.4 oz (65.5 kg)  02/21/20 143 lb 3.2 oz (65 kg)    Diabetic Foot Exam - Simple   No data filed    Lab Results  Component Value Date   WBC 4.3 05/23/2020   HGB 12.2 05/23/2020   HCT 36.1 05/23/2020   PLT 211.0 05/23/2020   GLUCOSE 94 05/23/2020   CHOL 203 (H) 07/03/2018   TRIG 204.0 (H) 07/03/2018   HDL 63.60 07/03/2018   LDLDIRECT 130.0 07/03/2018   LDLCALC 111 (H) 02/11/2014   ALT 26 05/23/2020   AST 18 05/23/2020   NA 140 05/23/2020   K 4.7 05/23/2020   CL 105 05/23/2020   CREATININE 0.68  05/23/2020   BUN 17 05/23/2020   CO2 28 05/23/2020   TSH 0.96 07/03/2018    Lab Results  Component Value Date   TSH 0.96 07/03/2018   Lab Results  Component Value Date   WBC 4.3 05/23/2020   HGB 12.2 05/23/2020   HCT 36.1 05/23/2020   MCV 91.4 05/23/2020   PLT 211.0 05/23/2020   Lab Results  Component Value Date   NA 140 05/23/2020   K 4.7 05/23/2020   CO2 28 05/23/2020   GLUCOSE 94 05/23/2020   BUN 17 05/23/2020   CREATININE  0.68 05/23/2020   BILITOT 0.3 05/23/2020   ALKPHOS 63 05/23/2020   AST 18 05/23/2020   ALT 26 05/23/2020   PROT 6.4 05/23/2020   ALBUMIN 3.6 05/23/2020   CALCIUM 9.3 05/23/2020   GFR 119.45 05/23/2020   Lab Results  Component Value Date   CHOL 203 (H) 07/03/2018   Lab Results  Component Value Date   HDL 63.60 07/03/2018   Lab Results  Component Value Date   LDLCALC 111 (H) 02/11/2014   Lab Results  Component Value Date   TRIG 204.0 (H) 07/03/2018   Lab Results  Component Value Date   CHOLHDL 3 07/03/2018   No results found for: HGBA1C     Assessment & Plan:   Problem List Items Addressed This Visit    ADD (attention deficit disorder)    After failing numerous medications because her insurance forced her to try them she is back on Vyvanse and doing well. She was unable to concentrate in school on the others but is doing well on the Vyvanse.       Recurrent UTI    She feels well today but she has had 3 UTIs since august. An order is placed for UA and culture if she has symptoms she can call early to come in and give [place] a urine sample and then we can treat in the future. She is reminded to hydrate well with 80 ounces of water and to take cranberry and probiotic tabs daily. Use cotton undergarments and to void before and after coitus.        Other Visit Diagnoses    Urinary tract infection without hematuria, site unspecified    -  Primary   Relevant Orders   Urinalysis   Urine Culture       No orders of the defined types  were placed in this encounter.   I discussed the assessment and treatment plan with the patient. The patient was provided an opportunity to ask questions and all were answered. The patient agreed with the plan and demonstrated an understanding of the instructions.   The patient was advised to call back or seek an in-person evaluation if the symptoms worsen or if the condition fails to improve as anticipated.  I provided 15 minutes of face-to-face time during this encounter.   Danise Edge, MD [facility] [phone] (phone) [phone] (fax)  [facility]

## 2020-07-18 NOTE — Assessment & Plan Note (Signed)
She feels well today but she has had 3 UTIs since august. An order is placed for UA and culture if she has symptoms she can call early to come in and give [place] a urine sample and then we can treat in the future. She is reminded to hydrate well with 80 ounces of water and to take cranberry and probiotic tabs daily. Use cotton undergarments and to void before and after coitus.

## 2020-08-22 ENCOUNTER — Other Ambulatory Visit: Payer: Self-pay

## 2020-08-22 ENCOUNTER — Ambulatory Visit (INDEPENDENT_AMBULATORY_CARE_PROVIDER_SITE_OTHER): Payer: BC Managed Care – PPO | Admitting: Family Medicine

## 2020-08-22 ENCOUNTER — Encounter: Payer: Self-pay | Admitting: Family Medicine

## 2020-08-22 DIAGNOSIS — N926 Irregular menstruation, unspecified: Secondary | ICD-10-CM | POA: Diagnosis not present

## 2020-08-22 DIAGNOSIS — F988 Other specified behavioral and emotional disorders with onset usually occurring in childhood and adolescence: Secondary | ICD-10-CM

## 2020-08-22 MED ORDER — LISDEXAMFETAMINE DIMESYLATE 50 MG PO CAPS: 50.0000 mg | ORAL_CAPSULE | Freq: Every day | ORAL | 0 refills | Status: DC

## 2020-08-22 MED ORDER — VIORELE 0.15-0.02/0.01 MG (21/5) PO TABS: 1.0000 | ORAL_TABLET | Freq: Every day | ORAL | 3 refills | Status: DC

## 2020-08-22 NOTE — Assessment & Plan Note (Signed)
Given refill on viorele as she is switching pharmacies

## 2020-08-22 NOTE — Assessment & Plan Note (Signed)
Doing well Vyvanse 50 mg daily no side effects back on Vyvanse and it is working well. Is given 3 months of refills.

## 2020-08-22 NOTE — Progress Notes (Signed)
[name], female    DOB: 05-27-1990  Age: 30 y.o. MRN: [id number]    Subjective:  Subjective  HPI Katie Tucker presents for office visit today for medication management for ADD. She states that she is doing well and has no recent sicknesses or ER visits to report. She denies any chest pain, SOB, fever, abdominal pain, cough, chills, sore throat, dysuria, urinary incontinence, back pain, HA, or N/VD. She reports that her ears have been feeling clogged which was due to her allergies.    Review of Systems  Constitutional: Negative for chills, fatigue and fever.  HENT: Negative for congestion, ear pain, rhinorrhea, sinus pressure, sinus pain and sore throat.   Eyes: Negative for pain.  Respiratory: Negative for cough and shortness of breath.   Cardiovascular: Negative for chest pain, palpitations and leg swelling.  Gastrointestinal: Negative for abdominal pain, blood in stool, diarrhea, nausea and vomiting.  Genitourinary: Negative for decreased urine volume, flank pain, frequency, vaginal bleeding and vaginal discharge.  Musculoskeletal: Negative for back pain.  Neurological: Negative for headaches.    History Past Medical History:  Diagnosis Date  . ADD (attention deficit disorder) 4 th grade  . Cerumen impaction 01/29/2013  . Cervical cancer screening 04/29/2014  . Chicken pox as a child  . Dermatitis 07/16/2015  . Hyperlipidemia, mild 01/18/2016  . Preventative health care 12/25/2011    She has a past surgical history that includes Tympanostomy tube placement.   Her family history includes Cancer in her maternal grandmother; Cancer (age of onset: 35) in her father; Hyperlipidemia in her mother; Hypertension in her father and paternal grandfather; Learning disabilities in her sister; Stroke in her maternal grandmother; Thyroid disease in her mother.She reports that she has never smoked. She has never used smokeless tobacco. She reports current alcohol use of about 1.0  standard drink of alcohol per week. She reports that she does not use drugs.  No current outpatient medications on file prior to visit.   No current facility-administered medications on file prior to visit.     Objective:  Objective  Physical Exam Constitutional:      General: She is not in acute distress.    Appearance: Normal appearance. She is not ill-appearing or toxic-appearing.  HENT:     Head: Normocephalic and atraumatic.     Right Ear: Tympanic membrane, ear canal and external ear normal.     Left Ear: Tympanic membrane, ear canal and external ear normal.     Nose: No congestion or rhinorrhea.  Eyes:     Extraocular Movements: Extraocular movements intact.     Pupils: Pupils are equal, round, and reactive to light.  Cardiovascular:     Rate and Rhythm: Normal rate and regular rhythm.     Pulses: Normal pulses.     Heart sounds: Normal heart sounds. No murmur heard.   Pulmonary:     Effort: Pulmonary effort is normal. No respiratory distress.     Breath sounds: Normal breath sounds. No wheezing, rhonchi or rales.  Abdominal:     General: Bowel sounds are normal.     Palpations: Abdomen is soft. There is no mass.     Tenderness: There is no abdominal tenderness. There is no guarding.     Hernia: No hernia is present.  Musculoskeletal:        General: Normal range of motion.     Cervical back: Normal range of motion and neck supple.  Skin:    General: Skin is warm  and dry.  Neurological:     Mental Status: She is alert and oriented to person, place, and time.  Psychiatric:        Behavior: Behavior normal.    BP 104/62   Pulse 87   Temp 98.1 F (36.7 C)   Resp 16   Wt 135 lb 9.6 oz (61.5 kg)   SpO2 99%   BMI 24.02 kg/m  Wt Readings from Last 3 Encounters:  08/22/20 135 lb 9.6 oz (61.5 kg)  05/23/20 144 lb 3.2 oz (65.4 kg)  05/18/20 144 lb 6.4 oz (65.5 kg)     Lab Results  Component Value Date   WBC 4.3 05/23/2020   HGB 12.2 05/23/2020   HCT 36.1  05/23/2020   PLT 211.0 05/23/2020   GLUCOSE 94 05/23/2020   CHOL 203 (H) 07/03/2018   TRIG 204.0 (H) 07/03/2018   HDL 63.60 07/03/2018   LDLDIRECT 130.0 07/03/2018   LDLCALC 111 (H) 02/11/2014   ALT 26 05/23/2020   AST 18 05/23/2020   NA 140 05/23/2020   K 4.7 05/23/2020   CL 105 05/23/2020   CREATININE 0.68 05/23/2020   BUN 17 05/23/2020   CO2 28 05/23/2020   TSH 0.96 07/03/2018    DG Abd 1 View  Result Date: 05/18/2020 CLINICAL DATA:  Right-sided CVA pain.  UTI symptoms. EXAM: ABDOMEN - 1 VIEW COMPARISON:  No prior. FINDINGS: Soft tissue structures are unremarkable. Stool noted throughout the colon. This makes evaluation for renal stone disease difficult. No bowel distention or free air. No renal or ureteral stones noted. Mild lumbar scoliosis concave right. No acute bony abnormality. IMPRESSION: Stool noted throughout the colon. This makes evaluation for renal stone disease difficult. No renal or ureteral stones noted. Electronically Signed   By: Maisie Fus  Register   On: 05/18/2020 12:55     Assessment & Plan:  Plan    Meds ordered this encounter  Medications  . lisdexamfetamine (VYVANSE) 50 MG capsule    Sig: Take 1 capsule (50 mg total) by mouth daily. May 2022    Dispense:  30 capsule    Refill:  0  . VIORELE 0.15-0.02/0.01 MG (21/5) tablet    Sig: Take 1 tablet by mouth daily.    Dispense:  84 tablet    Refill:  3  . lisdexamfetamine (VYVANSE) 50 MG capsule    Sig: Take 1 capsule (50 mg total) by mouth daily. June 2022    Dispense:  30 capsule    Refill:  0  . lisdexamfetamine (VYVANSE) 50 MG capsule    Sig: Take 1 capsule (50 mg total) by mouth daily. July 2022    Dispense:  30 capsule    Refill:  0    Problem List Items Addressed This Visit    ADD (attention deficit disorder)    Doing well Vyvanse 50 mg daily no side effects back on Vyvanse and it is working well. Is given 3 months of refills.       Irregular menses    Given refill on viorele as she is  switching pharmacies         Follow-up: Return in about 6 months (around 02/22/2021) for annual exam.   I,David Hanna,acting as a scribe for Danise Edge, MD.,have documented all relevant documentation on the behalf of Danise Edge, MD,as directed by  Danise Edge, MD while in the presence of Danise Edge, MD.  I, Bradd Canary, MD personally performed the services described in this documentation. All medical record  entries made by the scribe were at my direction and in my presence. I have reviewed the chart and agree that the record reflects my personal performance and is accurate and complete

## 2020-10-16 DIAGNOSIS — S60221A Contusion of right hand, initial encounter: Secondary | ICD-10-CM | POA: Diagnosis not present

## 2020-10-18 ENCOUNTER — Encounter: Payer: Self-pay | Admitting: Family Medicine

## 2020-10-31 ENCOUNTER — Other Ambulatory Visit: Payer: Self-pay | Admitting: Family Medicine

## 2020-10-31 DIAGNOSIS — N390 Urinary tract infection, site not specified: Secondary | ICD-10-CM

## 2020-11-01 ENCOUNTER — Other Ambulatory Visit: Payer: Self-pay | Admitting: Family Medicine

## 2020-11-01 ENCOUNTER — Encounter: Payer: Self-pay | Admitting: Family Medicine

## 2020-11-01 ENCOUNTER — Other Ambulatory Visit (INDEPENDENT_AMBULATORY_CARE_PROVIDER_SITE_OTHER): Payer: BC Managed Care – PPO

## 2020-11-01 ENCOUNTER — Other Ambulatory Visit: Payer: Self-pay

## 2020-11-01 DIAGNOSIS — N390 Urinary tract infection, site not specified: Secondary | ICD-10-CM | POA: Diagnosis not present

## 2020-11-01 LAB — URINALYSIS, ROUTINE W REFLEX MICROSCOPIC
Bilirubin Urine: NEGATIVE
Ketones, ur: NEGATIVE
Nitrite: NEGATIVE
Specific Gravity, Urine: 1.015 (ref 1.000–1.030)
Total Protein, Urine: NEGATIVE
Urine Glucose: NEGATIVE
Urobilinogen, UA: 0.2 (ref 0.0–1.0)
pH: 7.5 (ref 5.0–8.0)

## 2020-11-01 MED ORDER — CEFDINIR 300 MG PO CAPS: 300.0000 mg | ORAL_CAPSULE | Freq: Two times a day (BID) | ORAL | 0 refills | Status: AC

## 2020-11-01 NOTE — Telephone Encounter (Signed)
Pt is schedule

## 2020-11-03 LAB — URINE CULTURE
MICRO NUMBER:: 12114379
SPECIMEN QUALITY:: ADEQUATE

## 2020-11-28 ENCOUNTER — Other Ambulatory Visit: Payer: Self-pay | Admitting: Family Medicine

## 2020-11-28 ENCOUNTER — Encounter: Payer: Self-pay | Admitting: Family Medicine

## 2020-11-28 MED ORDER — LISDEXAMFETAMINE DIMESYLATE 50 MG PO CAPS: 50.0000 mg | ORAL_CAPSULE | Freq: Every day | ORAL | 0 refills | Status: DC

## 2020-11-28 NOTE — Telephone Encounter (Signed)
Pt is requesting next 3 months of refills for VyVanse   Patient is requesting a refill of the following medications: Requested Prescriptions   Pending Prescriptions Disp Refills   lisdexamfetamine (VYVANSE) 50 MG capsule 30 capsule 0    Sig: Take 1 capsule (50 mg total) by mouth daily. May 2022    Date of patient request: 11/28/20 Last office visit: 08/22/20 Date of last refill: 08/22/20 X 3 months  Last refill amount: 30 + 0 Follow up time period per chart: 04/24/21

## 2020-11-29 NOTE — Telephone Encounter (Deleted)
Dr. Abner Greenspan has sent in 3 months worth to [facility] on yesterday already.  Just check back with them.

## 2020-12-06 DIAGNOSIS — N390 Urinary tract infection, site not specified: Secondary | ICD-10-CM | POA: Diagnosis not present

## 2020-12-06 DIAGNOSIS — B958 Unspecified staphylococcus as the cause of diseases classified elsewhere: Secondary | ICD-10-CM | POA: Diagnosis not present

## 2020-12-06 DIAGNOSIS — R350 Frequency of micturition: Secondary | ICD-10-CM | POA: Diagnosis not present

## 2020-12-27 ENCOUNTER — Encounter: Payer: Self-pay | Admitting: Family Medicine

## 2020-12-27 DIAGNOSIS — N390 Urinary tract infection, site not specified: Secondary | ICD-10-CM | POA: Diagnosis not present

## 2020-12-27 DIAGNOSIS — B9620 Unspecified Escherichia coli [E. coli] as the cause of diseases classified elsewhere: Secondary | ICD-10-CM | POA: Diagnosis not present

## 2020-12-28 ENCOUNTER — Other Ambulatory Visit: Payer: Self-pay | Admitting: Family Medicine

## 2020-12-28 MED ORDER — LISDEXAMFETAMINE DIMESYLATE 50 MG PO CAPS: 50.0000 mg | ORAL_CAPSULE | Freq: Every day | ORAL | 0 refills | Status: AC

## 2020-12-28 NOTE — Telephone Encounter (Signed)
So I did take the [facility] out but we only sent that one rx to [facility] and not the other two.  She just need the rx for this month and next month sent to [facility].    Sorry about this.

## 2021-01-03 ENCOUNTER — Encounter: Payer: Self-pay | Admitting: Family Medicine

## 2021-01-05 ENCOUNTER — Encounter: Payer: Self-pay | Admitting: Family Medicine

## 2021-01-19 DIAGNOSIS — N390 Urinary tract infection, site not specified: Secondary | ICD-10-CM | POA: Diagnosis not present

## 2021-01-26 DIAGNOSIS — E559 Vitamin D deficiency, unspecified: Secondary | ICD-10-CM | POA: Diagnosis not present

## 2021-01-26 DIAGNOSIS — Z0000 Encounter for general adult medical examination without abnormal findings: Secondary | ICD-10-CM | POA: Diagnosis not present

## 2021-01-26 DIAGNOSIS — Z13220 Encounter for screening for lipoid disorders: Secondary | ICD-10-CM | POA: Diagnosis not present

## 2021-01-26 DIAGNOSIS — F900 Attention-deficit hyperactivity disorder, predominantly inattentive type: Secondary | ICD-10-CM | POA: Diagnosis not present

## 2021-01-26 DIAGNOSIS — Z3041 Encounter for surveillance of contraceptive pills: Secondary | ICD-10-CM | POA: Diagnosis not present

## 2021-01-26 DIAGNOSIS — Z0001 Encounter for general adult medical examination with abnormal findings: Secondary | ICD-10-CM | POA: Diagnosis not present

## 2021-02-07 ENCOUNTER — Other Ambulatory Visit: Payer: Self-pay | Admitting: Family Medicine

## 2021-02-07 DIAGNOSIS — M533 Sacrococcygeal disorders, not elsewhere classified: Secondary | ICD-10-CM | POA: Diagnosis not present

## 2021-03-30 DIAGNOSIS — R8271 Bacteriuria: Secondary | ICD-10-CM | POA: Diagnosis not present

## 2021-03-30 DIAGNOSIS — N390 Urinary tract infection, site not specified: Secondary | ICD-10-CM | POA: Diagnosis not present

## 2021-03-30 DIAGNOSIS — R350 Frequency of micturition: Secondary | ICD-10-CM | POA: Diagnosis not present

## 2021-04-24 ENCOUNTER — Encounter: Payer: BC Managed Care – PPO | Admitting: Family Medicine

## 2021-05-22 DIAGNOSIS — J0100 Acute maxillary sinusitis, unspecified: Secondary | ICD-10-CM | POA: Diagnosis not present

## 2021-07-02 DIAGNOSIS — N390 Urinary tract infection, site not specified: Secondary | ICD-10-CM | POA: Diagnosis not present

## 2021-07-02 DIAGNOSIS — B9620 Unspecified Escherichia coli [E. coli] as the cause of diseases classified elsewhere: Secondary | ICD-10-CM | POA: Diagnosis not present

## 2021-08-10 DIAGNOSIS — Z3041 Encounter for surveillance of contraceptive pills: Secondary | ICD-10-CM | POA: Diagnosis not present

## 2021-08-10 DIAGNOSIS — F900 Attention-deficit hyperactivity disorder, predominantly inattentive type: Secondary | ICD-10-CM | POA: Diagnosis not present

## 2021-10-04 DIAGNOSIS — R350 Frequency of micturition: Secondary | ICD-10-CM | POA: Diagnosis not present

## 2021-10-04 DIAGNOSIS — N390 Urinary tract infection, site not specified: Secondary | ICD-10-CM | POA: Diagnosis not present

## 2021-10-04 DIAGNOSIS — B9689 Other specified bacterial agents as the cause of diseases classified elsewhere: Secondary | ICD-10-CM | POA: Diagnosis not present

## 2021-12-05 DIAGNOSIS — R350 Frequency of micturition: Secondary | ICD-10-CM | POA: Diagnosis not present

## 2021-12-05 DIAGNOSIS — N390 Urinary tract infection, site not specified: Secondary | ICD-10-CM | POA: Diagnosis not present

## 2022-02-13 DIAGNOSIS — F900 Attention-deficit hyperactivity disorder, predominantly inattentive type: Secondary | ICD-10-CM | POA: Diagnosis not present

## 2022-05-10 DIAGNOSIS — F900 Attention-deficit hyperactivity disorder, predominantly inattentive type: Secondary | ICD-10-CM | POA: Diagnosis not present

## 2022-05-14 DIAGNOSIS — H47322 Drusen of optic disc, left eye: Secondary | ICD-10-CM | POA: Diagnosis not present

## 2022-05-22 DIAGNOSIS — R8271 Bacteriuria: Secondary | ICD-10-CM | POA: Diagnosis not present

## 2022-05-23 DIAGNOSIS — N390 Urinary tract infection, site not specified: Secondary | ICD-10-CM | POA: Diagnosis not present

## 2022-05-23 DIAGNOSIS — R350 Frequency of micturition: Secondary | ICD-10-CM | POA: Diagnosis not present

## 2022-05-31 DIAGNOSIS — Z01419 Encounter for gynecological examination (general) (routine) without abnormal findings: Secondary | ICD-10-CM | POA: Diagnosis not present

## 2022-05-31 DIAGNOSIS — M545 Low back pain, unspecified: Secondary | ICD-10-CM | POA: Diagnosis not present

## 2022-05-31 DIAGNOSIS — Z6827 Body mass index (BMI) 27.0-27.9, adult: Secondary | ICD-10-CM | POA: Diagnosis not present

## 2022-05-31 DIAGNOSIS — N760 Acute vaginitis: Secondary | ICD-10-CM | POA: Diagnosis not present

## 2022-05-31 DIAGNOSIS — Z124 Encounter for screening for malignant neoplasm of cervix: Secondary | ICD-10-CM | POA: Diagnosis not present

## 2022-05-31 DIAGNOSIS — Z113 Encounter for screening for infections with a predominantly sexual mode of transmission: Secondary | ICD-10-CM | POA: Diagnosis not present

## 2022-06-14 DIAGNOSIS — M438X6 Other specified deforming dorsopathies, lumbar region: Secondary | ICD-10-CM | POA: Diagnosis not present

## 2022-06-14 DIAGNOSIS — F900 Attention-deficit hyperactivity disorder, predominantly inattentive type: Secondary | ICD-10-CM | POA: Diagnosis not present

## 2022-06-14 DIAGNOSIS — M545 Low back pain, unspecified: Secondary | ICD-10-CM | POA: Diagnosis not present

## 2022-06-28 DIAGNOSIS — Z13220 Encounter for screening for lipoid disorders: Secondary | ICD-10-CM | POA: Diagnosis not present

## 2022-06-28 DIAGNOSIS — Z0000 Encounter for general adult medical examination without abnormal findings: Secondary | ICD-10-CM | POA: Diagnosis not present

## 2022-08-22 DIAGNOSIS — J0140 Acute pansinusitis, unspecified: Secondary | ICD-10-CM | POA: Diagnosis not present

## 2022-09-24 DIAGNOSIS — R1011 Right upper quadrant pain: Secondary | ICD-10-CM | POA: Diagnosis not present

## 2022-09-24 DIAGNOSIS — R0789 Other chest pain: Secondary | ICD-10-CM | POA: Diagnosis not present

## 2022-10-02 DIAGNOSIS — R1011 Right upper quadrant pain: Secondary | ICD-10-CM | POA: Diagnosis not present

## 2022-10-15 DIAGNOSIS — E7800 Pure hypercholesterolemia, unspecified: Secondary | ICD-10-CM | POA: Diagnosis not present

## 2022-10-15 DIAGNOSIS — R799 Abnormal finding of blood chemistry, unspecified: Secondary | ICD-10-CM | POA: Diagnosis not present

## 2022-12-05 IMAGING — DX DG ABDOMEN 1V
1 series · 1 of 1 positions shown · non-contrast
Comparison: No prior.

CLINICAL DATA: Right-sided CVA pain.  UTI symptoms.

EXAM:
ABDOMEN - 1 VIEW

[abdomen kub]
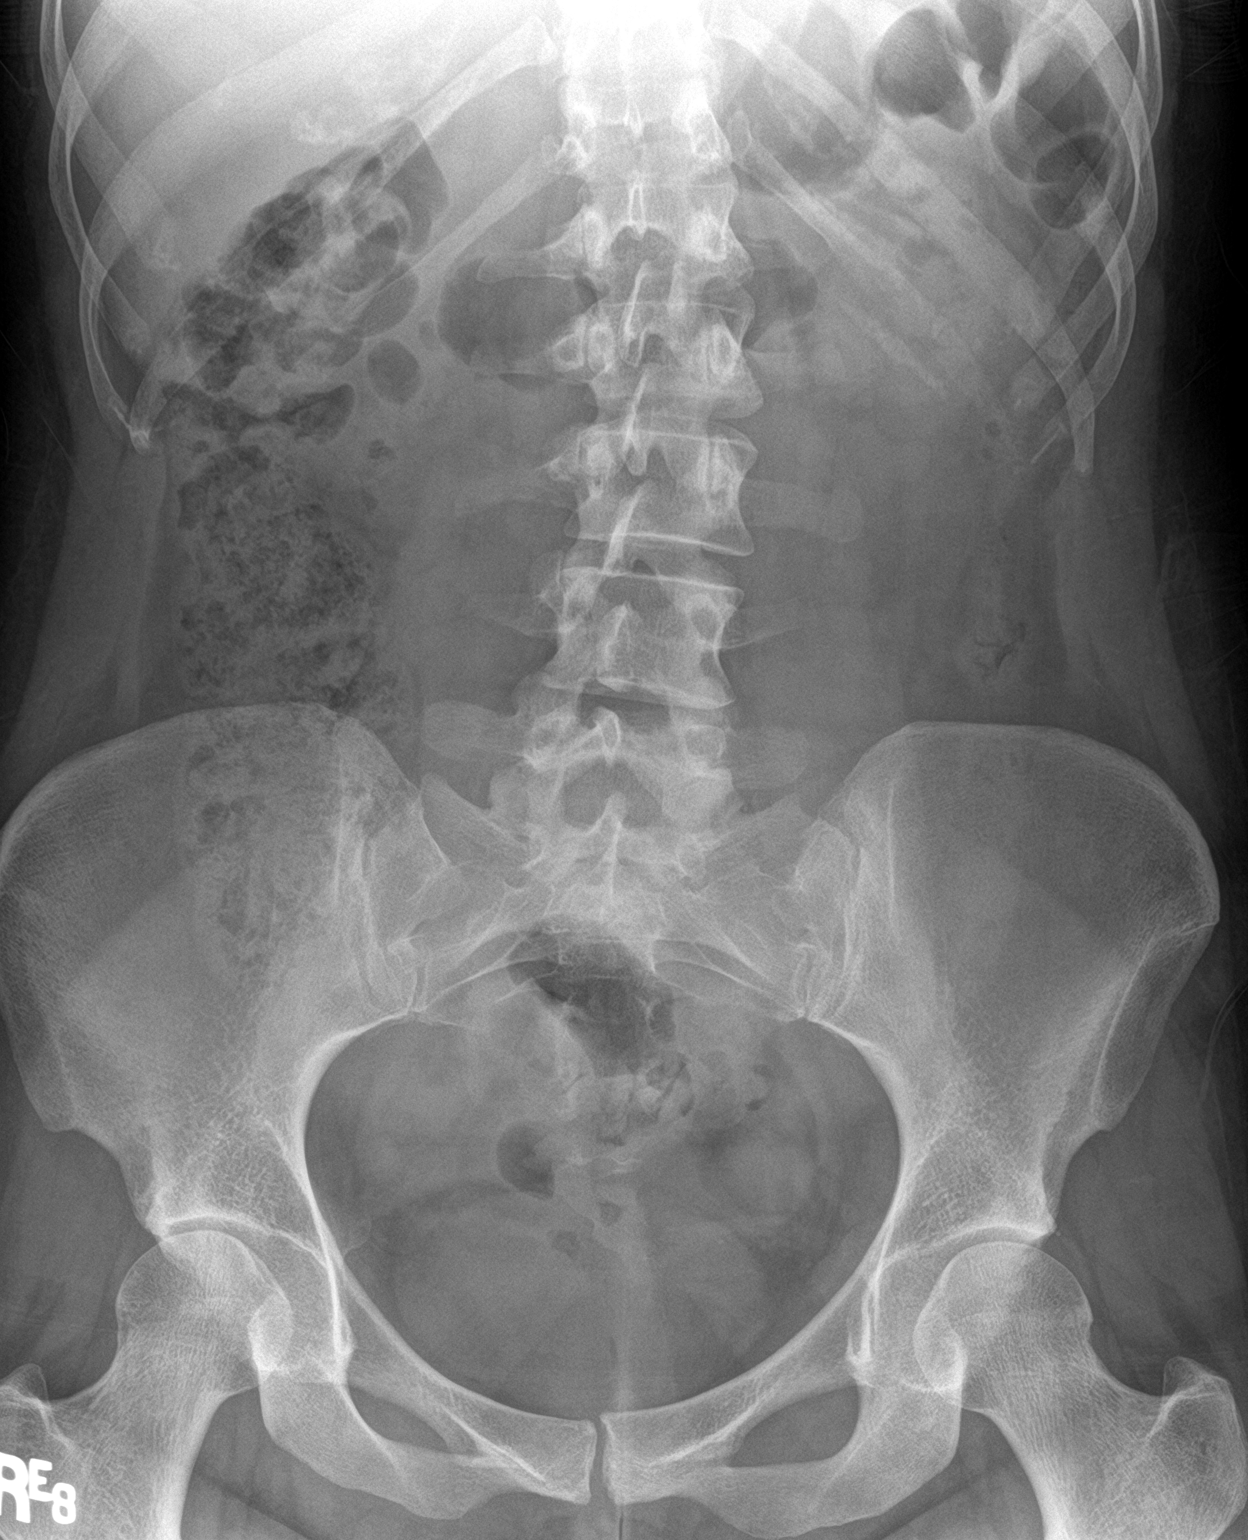

[1 of 1 positions shown; findings below may reference images not displayed]

FINDINGS: Soft tissue structures are unremarkable. Stool noted throughout the
colon. This makes evaluation for renal stone disease difficult. No
bowel distention or free air. No renal or ureteral stones noted.
Mild lumbar scoliosis concave right. No acute bony abnormality.
IMPRESSION: Stool noted throughout the colon. This makes evaluation for renal
stone disease difficult. No renal or ureteral stones noted.

## 2022-12-26 DIAGNOSIS — R300 Dysuria: Secondary | ICD-10-CM | POA: Diagnosis not present

## 2023-01-13 DIAGNOSIS — G43109 Migraine with aura, not intractable, without status migrainosus: Secondary | ICD-10-CM | POA: Diagnosis not present

## 2023-01-13 DIAGNOSIS — Z309 Encounter for contraceptive management, unspecified: Secondary | ICD-10-CM | POA: Diagnosis not present

## 2023-01-17 DIAGNOSIS — M25552 Pain in left hip: Secondary | ICD-10-CM | POA: Diagnosis not present

## 2023-02-05 DIAGNOSIS — R197 Diarrhea, unspecified: Secondary | ICD-10-CM | POA: Diagnosis not present

## 2023-02-05 DIAGNOSIS — R519 Headache, unspecified: Secondary | ICD-10-CM | POA: Diagnosis not present

## 2023-03-07 DIAGNOSIS — F900 Attention-deficit hyperactivity disorder, predominantly inattentive type: Secondary | ICD-10-CM | POA: Diagnosis not present

## 2023-03-07 DIAGNOSIS — E7800 Pure hypercholesterolemia, unspecified: Secondary | ICD-10-CM | POA: Diagnosis not present

## 2023-03-07 DIAGNOSIS — R799 Abnormal finding of blood chemistry, unspecified: Secondary | ICD-10-CM | POA: Diagnosis not present

## 2023-03-10 DIAGNOSIS — K2100 Gastro-esophageal reflux disease with esophagitis, without bleeding: Secondary | ICD-10-CM | POA: Diagnosis not present

## 2023-03-13 DIAGNOSIS — K5900 Constipation, unspecified: Secondary | ICD-10-CM | POA: Diagnosis not present

## 2023-03-19 DIAGNOSIS — Z309 Encounter for contraceptive management, unspecified: Secondary | ICD-10-CM | POA: Diagnosis not present

## 2023-03-19 DIAGNOSIS — G43109 Migraine with aura, not intractable, without status migrainosus: Secondary | ICD-10-CM | POA: Diagnosis not present

## 2023-04-10 DIAGNOSIS — Z3202 Encounter for pregnancy test, result negative: Secondary | ICD-10-CM | POA: Diagnosis not present

## 2023-04-10 DIAGNOSIS — Z30430 Encounter for insertion of intrauterine contraceptive device: Secondary | ICD-10-CM | POA: Diagnosis not present

## 2023-05-22 DIAGNOSIS — Z30431 Encounter for routine checking of intrauterine contraceptive device: Secondary | ICD-10-CM | POA: Diagnosis not present

## 2023-06-09 DIAGNOSIS — Z113 Encounter for screening for infections with a predominantly sexual mode of transmission: Secondary | ICD-10-CM | POA: Diagnosis not present

## 2023-06-09 DIAGNOSIS — Z131 Encounter for screening for diabetes mellitus: Secondary | ICD-10-CM | POA: Diagnosis not present

## 2023-06-09 DIAGNOSIS — Z13220 Encounter for screening for lipoid disorders: Secondary | ICD-10-CM | POA: Diagnosis not present

## 2023-06-09 DIAGNOSIS — Z01419 Encounter for gynecological examination (general) (routine) without abnormal findings: Secondary | ICD-10-CM | POA: Diagnosis not present

## 2023-06-09 DIAGNOSIS — Z6830 Body mass index (BMI) 30.0-30.9, adult: Secondary | ICD-10-CM | POA: Diagnosis not present

## 2023-06-09 DIAGNOSIS — R319 Hematuria, unspecified: Secondary | ICD-10-CM | POA: Diagnosis not present

## 2023-06-09 DIAGNOSIS — Z1329 Encounter for screening for other suspected endocrine disorder: Secondary | ICD-10-CM | POA: Diagnosis not present

## 2023-06-09 DIAGNOSIS — Z13228 Encounter for screening for other metabolic disorders: Secondary | ICD-10-CM | POA: Diagnosis not present

## 2023-09-01 DIAGNOSIS — K2100 Gastro-esophageal reflux disease with esophagitis, without bleeding: Secondary | ICD-10-CM | POA: Diagnosis not present

## 2023-09-01 DIAGNOSIS — R5383 Other fatigue: Secondary | ICD-10-CM | POA: Diagnosis not present

## 2023-09-05 DIAGNOSIS — R300 Dysuria: Secondary | ICD-10-CM | POA: Diagnosis not present

## 2023-10-02 DIAGNOSIS — R1030 Lower abdominal pain, unspecified: Secondary | ICD-10-CM | POA: Diagnosis not present

## 2023-10-02 DIAGNOSIS — R1319 Other dysphagia: Secondary | ICD-10-CM | POA: Diagnosis not present

## 2023-10-02 DIAGNOSIS — F900 Attention-deficit hyperactivity disorder, predominantly inattentive type: Secondary | ICD-10-CM | POA: Diagnosis not present

## 2023-10-02 DIAGNOSIS — K219 Gastro-esophageal reflux disease without esophagitis: Secondary | ICD-10-CM | POA: Diagnosis not present

## 2023-10-02 DIAGNOSIS — E739 Lactose intolerance, unspecified: Secondary | ICD-10-CM | POA: Diagnosis not present

## 2023-11-07 DIAGNOSIS — Z1329 Encounter for screening for other suspected endocrine disorder: Secondary | ICD-10-CM | POA: Diagnosis not present

## 2023-11-07 DIAGNOSIS — Z131 Encounter for screening for diabetes mellitus: Secondary | ICD-10-CM | POA: Diagnosis not present

## 2023-11-07 DIAGNOSIS — Z130 Encounter for screening for diseases of the blood and blood-forming organs and certain disorders involving the immune mechanism: Secondary | ICD-10-CM | POA: Diagnosis not present

## 2023-11-07 DIAGNOSIS — K219 Gastro-esophageal reflux disease without esophagitis: Secondary | ICD-10-CM | POA: Diagnosis not present

## 2023-11-07 DIAGNOSIS — E7800 Pure hypercholesterolemia, unspecified: Secondary | ICD-10-CM | POA: Diagnosis not present

## 2023-11-07 DIAGNOSIS — R635 Abnormal weight gain: Secondary | ICD-10-CM | POA: Diagnosis not present

## 2023-11-11 DIAGNOSIS — R300 Dysuria: Secondary | ICD-10-CM | POA: Diagnosis not present

## 2023-11-11 DIAGNOSIS — F900 Attention-deficit hyperactivity disorder, predominantly inattentive type: Secondary | ICD-10-CM | POA: Diagnosis not present

## 2023-11-17 DIAGNOSIS — E7800 Pure hypercholesterolemia, unspecified: Secondary | ICD-10-CM | POA: Diagnosis not present

## 2023-11-21 DIAGNOSIS — K2289 Other specified disease of esophagus: Secondary | ICD-10-CM | POA: Diagnosis not present

## 2023-11-21 DIAGNOSIS — R1310 Dysphagia, unspecified: Secondary | ICD-10-CM | POA: Diagnosis not present

## 2023-11-21 DIAGNOSIS — K219 Gastro-esophageal reflux disease without esophagitis: Secondary | ICD-10-CM | POA: Diagnosis not present

## 2023-11-26 DIAGNOSIS — Z6832 Body mass index (BMI) 32.0-32.9, adult: Secondary | ICD-10-CM | POA: Diagnosis not present

## 2023-11-26 DIAGNOSIS — K219 Gastro-esophageal reflux disease without esophagitis: Secondary | ICD-10-CM | POA: Diagnosis not present

## 2023-12-05 DIAGNOSIS — E7800 Pure hypercholesterolemia, unspecified: Secondary | ICD-10-CM | POA: Diagnosis not present

## 2023-12-05 DIAGNOSIS — Z6831 Body mass index (BMI) 31.0-31.9, adult: Secondary | ICD-10-CM | POA: Diagnosis not present

## 2023-12-12 DIAGNOSIS — Z6831 Body mass index (BMI) 31.0-31.9, adult: Secondary | ICD-10-CM | POA: Diagnosis not present

## 2023-12-12 DIAGNOSIS — K219 Gastro-esophageal reflux disease without esophagitis: Secondary | ICD-10-CM | POA: Diagnosis not present

## 2023-12-17 DIAGNOSIS — E739 Lactose intolerance, unspecified: Secondary | ICD-10-CM | POA: Diagnosis not present

## 2023-12-17 DIAGNOSIS — R1030 Lower abdominal pain, unspecified: Secondary | ICD-10-CM | POA: Diagnosis not present

## 2023-12-17 DIAGNOSIS — K219 Gastro-esophageal reflux disease without esophagitis: Secondary | ICD-10-CM | POA: Diagnosis not present

## 2023-12-17 DIAGNOSIS — R1319 Other dysphagia: Secondary | ICD-10-CM | POA: Diagnosis not present

## 2023-12-18 ENCOUNTER — Telehealth (HOSPITAL_COMMUNITY): Payer: Self-pay | Admitting: *Deleted

## 2023-12-18 NOTE — Telephone Encounter (Signed)
 Attempted to contact patient to schedule OP MBS. Call not completed - unable to leave message @ [phone]. RKEEL

## 2023-12-24 ENCOUNTER — Telehealth (HOSPITAL_COMMUNITY): Payer: Self-pay | Admitting: *Deleted

## 2023-12-24 NOTE — Telephone Encounter (Signed)
 Attempted to contact at [phone] to set-up swallow test, left VM with request for call back. (AHARRIS)

## 2023-12-25 ENCOUNTER — Telehealth (HOSPITAL_COMMUNITY): Payer: Self-pay | Admitting: *Deleted

## 2023-12-25 NOTE — Telephone Encounter (Signed)
 Attempted to contact patient to schedule OP MBS. Left VM @ [phone] requesting a call back. RKEEL

## 2023-12-31 ENCOUNTER — Telehealth (HOSPITAL_COMMUNITY): Payer: Self-pay

## 2023-12-31 NOTE — Telephone Encounter (Signed)
 Spoke with patient who stated she spoke with MD office Friday 12/26/23 letting them know she was declining the OP MBS order at this time. She was advised to seek new order if anything changes. (AHARRIS)
